# Patient Record
Sex: Male | Born: 1990 | Race: White | Hispanic: No | Marital: Married | Smoking: Never smoker
Health system: Southern US, Community
[De-identification: ages and names within clinical notes are randomized; demographics above are authoritative.]

## PROBLEM LIST (undated history)

## (undated) HISTORY — PX: OTHER SURGICAL HISTORY: SHX169

---
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
## 2012-02-13 ENCOUNTER — Ambulatory Visit (INDEPENDENT_AMBULATORY_CARE_PROVIDER_SITE_OTHER): Payer: BC Managed Care – PPO | Admitting: Pediatrics

## 2012-02-13 DIAGNOSIS — Z289 Immunization not carried out for unspecified reason: Secondary | ICD-10-CM

## 2012-02-26 NOTE — Progress Notes (Signed)
Reviewed immunizations and shots up to date. No vaccine given

## 2012-04-29 ENCOUNTER — Ambulatory Visit (INDEPENDENT_AMBULATORY_CARE_PROVIDER_SITE_OTHER): Payer: BC Managed Care – PPO | Admitting: Pediatrics

## 2012-04-29 VITALS — BP 116/72 | Ht 72.0 in | Wt 160.9 lb

## 2012-04-29 DIAGNOSIS — F988 Other specified behavioral and emotional disorders with onset usually occurring in childhood and adolescence: Secondary | ICD-10-CM

## 2012-04-29 MED ORDER — METHYLPHENIDATE HCL 5 MG PO TABS: 5.0000 mg | ORAL_TABLET | Freq: Two times a day (BID) | ORAL | Status: DC

## 2012-04-29 NOTE — Progress Notes (Signed)
Subjective:     Patient ID: David Elliott, male   DOB: Aug 06, 1990, 21 y.o.   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 [employer] Was taking Concerta 54 mg, but thinks he needs something shorter acting Having trouble focusing during class, some trouble when studying Ease of distractibility, lack of focus Has not taken medication in 3-4 years On Concerta, had some trouble with appetite suppression "Coffee helps a little bit," but not quite enough  NO PMH of heart murmur of abnormal heart rhythm BP is wnl  Calculus, English, Environmental Studies, Lobbyist  Review of Systems [deferred]    Objective:   Physical Exam [deferred]    Assessment:     21 year old CM with Attention Deficit Disorder, has been managed without medication for past 19 years, though now having increased difficulty having just started college.    Plan:     1. Reviewed past and current history of ADD for this patient 2. Will treat as though stimulant naive based on time since last prescription 3. Start with short-acting Methylphenidate 5 mg once or twice per day 4. Advised patient to start using earlier in the day and pay close attention to length of effect and any side effects 5. Will have to mail refills     Methylphenidate 5 mg At first, take during the day and track how long it lasts Track any side effects Mail refills  Total time = 26 minutes, >50% counseling

## 2012-04-29 NOTE — Addendum Note (Signed)
Addended by: Ferman Hamming B on: 04/29/2012 06:59 PM   Modules accepted: Level of Service

## 2012-05-07 ENCOUNTER — Telehealth: Payer: Self-pay

## 2012-05-07 ENCOUNTER — Other Ambulatory Visit: Payer: Self-pay | Admitting: Pediatrics

## 2012-05-07 DIAGNOSIS — F988 Other specified behavioral and emotional disorders with onset usually occurring in childhood and adolescence: Secondary | ICD-10-CM

## 2012-05-07 MED ORDER — METHYLPHENIDATE HCL 5 MG PO TABS: 5.0000 mg | ORAL_TABLET | Freq: Two times a day (BID) | ORAL | Status: DC

## 2012-05-07 NOTE — Telephone Encounter (Signed)
RX for Concerta 5mg 

## 2012-08-11 ENCOUNTER — Telehealth: Payer: Self-pay

## 2012-08-11 ENCOUNTER — Other Ambulatory Visit: Payer: Self-pay | Admitting: Pediatrics

## 2012-08-11 DIAGNOSIS — F988 Other specified behavioral and emotional disorders with onset usually occurring in childhood and adolescence: Secondary | ICD-10-CM

## 2012-08-11 MED ORDER — METHYLPHENIDATE HCL 5 MG PO TABS: 5.0000 mg | ORAL_TABLET | Freq: Two times a day (BID) | ORAL | Status: DC

## 2012-08-11 NOTE — Telephone Encounter (Signed)
RX for Ritalin 10mg 

## 2012-10-14 ENCOUNTER — Other Ambulatory Visit: Payer: Self-pay | Admitting: Pediatrics

## 2012-10-14 ENCOUNTER — Telehealth: Payer: Self-pay | Admitting: Pediatrics

## 2012-10-14 DIAGNOSIS — F988 Other specified behavioral and emotional disorders with onset usually occurring in childhood and adolescence: Secondary | ICD-10-CM

## 2012-10-14 MED ORDER — METHYLPHENIDATE HCL 5 MG PO TABS: 5.0000 mg | ORAL_TABLET | Freq: Two times a day (BID) | ORAL | Status: DC

## 2012-10-14 NOTE — Telephone Encounter (Signed)
Refill request methylphenidate 5 mg 2 x day

## 2013-01-07 ENCOUNTER — Encounter: Payer: Self-pay | Admitting: Pediatrics

## 2013-01-11 ENCOUNTER — Ambulatory Visit: Payer: Self-pay | Admitting: Pediatrics

## 2020-03-17 ENCOUNTER — Ambulatory Visit (INDEPENDENT_AMBULATORY_CARE_PROVIDER_SITE_OTHER): Payer: 59 | Admitting: Family Medicine

## 2020-03-17 ENCOUNTER — Other Ambulatory Visit: Payer: Self-pay

## 2020-03-17 ENCOUNTER — Encounter: Payer: Self-pay | Admitting: Family Medicine

## 2020-03-17 VITALS — BP 110/73 | HR 90 | Temp 97.2°F | Resp 17 | Ht 73.0 in | Wt 181.4 lb

## 2020-03-17 DIAGNOSIS — K429 Umbilical hernia without obstruction or gangrene: Secondary | ICD-10-CM

## 2020-03-17 DIAGNOSIS — R1031 Right lower quadrant pain: Secondary | ICD-10-CM | POA: Diagnosis not present

## 2020-03-17 DIAGNOSIS — Z7689 Persons encountering health services in other specified circumstances: Secondary | ICD-10-CM | POA: Diagnosis not present

## 2020-03-17 DIAGNOSIS — F909 Attention-deficit hyperactivity disorder, unspecified type: Secondary | ICD-10-CM

## 2020-03-17 DIAGNOSIS — Z09 Encounter for follow-up examination after completed treatment for conditions other than malignant neoplasm: Secondary | ICD-10-CM

## 2020-03-17 HISTORY — DX: Attention-deficit hyperactivity disorder, unspecified type: F90.9

## 2020-03-17 LAB — GLUCOSE, POCT (MANUAL RESULT ENTRY): POC Glucose: 116 mg/dl — AB (ref 70–99)

## 2020-03-17 LAB — POCT GLYCOSYLATED HEMOGLOBIN (HGB A1C)
HbA1c POC (<> result, manual entry): 4.9 % (ref 4.0–5.6)
HbA1c, POC (controlled diabetic range): 4.9 % (ref 0.0–7.0)
HbA1c, POC (prediabetic range): 4.9 % — AB (ref 5.7–6.4)
Hemoglobin A1C: 4.9 % (ref 4.0–5.6)

## 2020-03-17 NOTE — Progress Notes (Signed)
Patient [facility]   New Patient--Establish Care  Subjective:  Patient ID: David Elliott, male    DOB: 06-12-1991  Age: 29 y.o. MRN: [id number]  CC:  Chief Complaint  Patient presents with  . Establish Care  . Abdominal Pain    Pt states x2-3wks.     HPI NANDAN WILLEMS is a 29 year old male who presents to [facility] today.    There are no problems to display for this patient.   Past Medical History:  Diagnosis Date  . ADHD (attention deficit hyperactivity disorder) 03/17/2020   Current Status: This will be Mr. Cansler initial office visit with me. He was previously seeing Physician at college for his PCP needs. Since his last office visit, he is doing well with no complaints. He has c/o abdominal pain, r/t his history of umbilical hernia pain. Pressure, pain, and nausea began to slowly increase in the last 2-3 weeks. He has been told that he has an abdominal hernia while he underwent examination at [facility], but discovered that his insurance was no longer accepted at their office. He has moderate abdominal pain today. He denies fevers, chills, fatigue, recent infections, weight loss, and night sweats. He has not had any headaches, visual changes, dizziness, and falls. No chest pain, heart palpitations, cough and shortness of breath reported. No reports of GI problems such as nausea, vomiting, diarrhea, and constipation. He has no reports of blood in stools, dysuria and hematuria. No depression or anxiety reported today. He is taking all medications as prescribed.   Past Surgical History:  Procedure Laterality Date  . ADHD    . right shoulder surgery     2009, 2011    Family History  Problem Relation Age of Onset  . ADD / ADHD Father   . Atrial fibrillation Father   . ADD / ADHD Sister   . Cancer Maternal Grandmother   . Cancer Maternal Grandfather   . Heart disease Maternal Grandfather   . Atrial fibrillation  Paternal Uncle     Social History   Socioeconomic History  . Marital status: Single    Spouse name: Not on file  . Number of children: Not on file  . Years of education: Not on file  . Highest education level: Not on file  Occupational History  . Occupation: COMPUTER PROGRAMER  Tobacco Use  . Smoking status: Never Smoker  . Smokeless tobacco: Never Used  Substance and Sexual Activity  . Alcohol use: Yes  . Drug use: Yes    Frequency: 2.0 times per week    Types: Marijuana  . Sexual activity: Yes    Birth control/protection: Condom  Other Topics Concern  . Not on file  Social History Narrative  . Not on file   Social Determinants of Health   Financial Resource Strain:   . Difficulty of Paying Living Expenses:   Food Insecurity:   . Worried About Charity fundraiser in the Last Year:   . Arboriculturist in the Last Year:   Transportation Needs:   . Film/video editor (Medical):   Marland Kitchen Lack of Transportation (Non-Medical):   Physical Activity:   . Days of Exercise per Week:   . Minutes of Exercise per Session:   Stress:   . Feeling of Stress :   Social Connections:   . Frequency of Communication with Friends and Family:   . Frequency of Social Gatherings with  Friends and Family:   . Attends Religious Services:   . Active Member of Clubs or Organizations:   . Attends Archivist Meetings:   Marland Kitchen Marital Status:   Intimate Partner Violence:   . Fear of Current or Ex-Partner:   . Emotionally Abused:   Marland Kitchen Physically Abused:   . Sexually Abused:     Outpatient Medications Prior to Visit  Medication Sig Dispense Refill  . methylphenidate (RITALIN) 5 MG tablet Take 1 tablet (5 mg total) by mouth 2 (two) times daily. 60 tablet 0   No facility-administered medications prior to visit.    Allergies  Allergen Reactions  . Percocet [Oxycodone-Acetaminophen] Nausea Only    ROS Review of Systems  Constitutional: Negative.   HENT: Negative.   Eyes:  Negative.   Respiratory: Negative.   Cardiovascular: Negative.   Gastrointestinal: Positive for abdominal pain (mid to right quadrant) and nausea (occasional ).  Endocrine: Negative.   Genitourinary: Negative.   Musculoskeletal: Negative.   Skin: Negative.   Allergic/Immunologic: Negative.   Neurological: Negative.   Hematological: Negative.   Psychiatric/Behavioral: Negative.     Objective:    Physical Exam Vitals and nursing note reviewed.  Constitutional:      Appearance: Normal appearance.  HENT:     Head: Normocephalic and atraumatic.     Nose: Nose normal.     Mouth/Throat:     Mouth: Mucous membranes are moist.     Pharynx: Oropharynx is clear.  Cardiovascular:     Rate and Rhythm: Normal rate and regular rhythm.     Pulses: Normal pulses.     Heart sounds: Normal heart sounds.  Pulmonary:     Effort: Pulmonary effort is normal.     Breath sounds: Normal breath sounds.  Abdominal:     General: Bowel sounds are normal.     Palpations: Abdomen is soft.     Tenderness: There is abdominal tenderness. There is guarding.     Comments: Mid-right quadrant hernia palpated.   Musculoskeletal:        General: Normal range of motion.     Cervical back: Normal range of motion and neck supple.  Skin:    General: Skin is warm and dry.  Neurological:     General: No focal deficit present.     Mental Status: He is alert and oriented to person, place, and time.  Psychiatric:        Mood and Affect: Mood normal.        Behavior: Behavior normal.        Thought Content: Thought content normal.        Judgment: Judgment normal.     BP 110/73 (BP Location: Left Arm, Patient Position: Sitting, Cuff Size: Normal)   Pulse 90   Temp (!) 97.2 F (36.2 C)   Resp 17   Ht _0  (1.854 m)   Wt 181 lb 6.4 oz (82.3 kg)   SpO2 97%   BMI 23.93 kg/m  Wt Readings from Last 3 Encounters:  03/17/20 181 lb 6.4 oz (82.3 kg)  04/29/12 160 lb 14.4 oz (73 kg) (64 %, Z= 0.35)*  04/10/10  189 lb 1.6 oz (85.8 kg) (94 %, Z= 1.54)*   * Growth percentiles are based on CDC (Boys, 2-20 Years) data.     Health Maintenance Due  Topic Date Due  . Hepatitis C Screening  Never done  . HIV Screening  Never done  . TETANUS/TDAP  05/10/2015  . INFLUENZA VACCINE  03/05/2020    There are no preventive care reminders to display for this patient.  No results found for: TSH No results found for: WBC, HGB, HCT, MCV, PLT No results found for: NA, K, CHLORIDE, CO2, GLUCOSE, BUN, CREATININE, BILITOT, ALKPHOS, AST, ALT, PROT, ALBUMIN, CALCIUM, ANIONGAP, EGFR, GFR No results found for: CHOL No results found for: HDL No results found for: LDLCALC No results found for: TRIG No results found for: CHOLHDL Lab Results  Component Value Date   HGBA1C 4.9 03/17/2020   HGBA1C 4.9 03/17/2020   HGBA1C 4.9 (A) 03/17/2020   HGBA1C 4.9 03/17/2020      Assessment & Plan:   1. Establishing care with new doctor, encounter for - Urinalysis Dipstick - POC Glucose (CBG) - POC HgB A1c  2. Umbilical hernia without obstruction and without gangrene  3. Acute abdominal pain in right lower quadrant - US Abdomen Complete; Future  4. Follow up He will follow up as needed.   No orders of the defined types were placed in this encounter.   Orders Placed This Encounter  Procedures  . US Abdomen Complete  . Urinalysis Dipstick  . POC Glucose (CBG)  . POC HgB A1c    Referral Orders  No referral(s) requested today    Kathe Becton,  MSN, FNP-BC [address] [place], [place] [address] [phone] fax  Problem List Items Addressed This Visit    None    Visit Diagnoses    Establishing care with new doctor, encounter for    -  Primary   Relevant Orders   Urinalysis Dipstick   POC Glucose (CBG) (Completed)   POC HgB A1c (Completed)   Umbilical hernia without obstruction  and without gangrene       Acute abdominal pain in right lower quadrant       Relevant Orders   US Abdomen Complete   Follow up          No orders of the defined types were placed in this encounter.   Follow-up: No follow-ups on file.    Azzie Glatter, FNP

## 2020-03-18 ENCOUNTER — Encounter: Payer: Self-pay | Admitting: Family Medicine

## 2020-03-20 ENCOUNTER — Other Ambulatory Visit: Payer: Self-pay

## 2020-03-20 ENCOUNTER — Ambulatory Visit (HOSPITAL_COMMUNITY)
Admission: RE | Admit: 2020-03-20 | Discharge: 2020-03-20 | Disposition: A | Discharge status: Home / Self Care | Payer: 59 | Source: Ambulatory Visit | Attending: Family Medicine | Admitting: Family Medicine

## 2020-03-20 DIAGNOSIS — R1031 Right lower quadrant pain: Secondary | ICD-10-CM | POA: Diagnosis not present

## 2020-03-22 ENCOUNTER — Other Ambulatory Visit: Payer: Self-pay | Admitting: Family Medicine

## 2020-03-22 DIAGNOSIS — R1031 Right lower quadrant pain: Secondary | ICD-10-CM

## 2020-03-22 DIAGNOSIS — K439 Ventral hernia without obstruction or gangrene: Secondary | ICD-10-CM

## 2020-03-22 DIAGNOSIS — R1900 Intra-abdominal and pelvic swelling, mass and lump, unspecified site: Secondary | ICD-10-CM

## 2020-03-22 DIAGNOSIS — K469 Unspecified abdominal hernia without obstruction or gangrene: Secondary | ICD-10-CM

## 2020-03-23 ENCOUNTER — Telehealth: Payer: Self-pay

## 2020-03-23 ENCOUNTER — Encounter (HOSPITAL_BASED_OUTPATIENT_CLINIC_OR_DEPARTMENT_OTHER): Payer: Self-pay

## 2020-03-23 ENCOUNTER — Ambulatory Visit (HOSPITAL_BASED_OUTPATIENT_CLINIC_OR_DEPARTMENT_OTHER)
Admission: RE | Admit: 2020-03-23 | Discharge: 2020-03-23 | Disposition: A | Discharge status: Home / Self Care | Payer: 59 | Source: Ambulatory Visit | Attending: Family Medicine | Admitting: Family Medicine

## 2020-03-23 ENCOUNTER — Other Ambulatory Visit: Payer: Self-pay

## 2020-03-23 DIAGNOSIS — K439 Ventral hernia without obstruction or gangrene: Secondary | ICD-10-CM

## 2020-03-23 DIAGNOSIS — R1031 Right lower quadrant pain: Secondary | ICD-10-CM | POA: Insufficient documentation

## 2020-03-23 DIAGNOSIS — K760 Fatty (change of) liver, not elsewhere classified: Secondary | ICD-10-CM | POA: Diagnosis not present

## 2020-03-23 DIAGNOSIS — R1900 Intra-abdominal and pelvic swelling, mass and lump, unspecified site: Secondary | ICD-10-CM | POA: Diagnosis not present

## 2020-03-23 MED ORDER — IOHEXOL 300 MG/ML  SOLN
100.0000 mL | Freq: Once | INTRAMUSCULAR | Status: AC | PRN
  Administered 2020-03-23: 100 mL via INTRAVENOUS

## 2020-03-28 ENCOUNTER — Encounter: Payer: Self-pay | Admitting: Family Medicine

## 2020-03-29 ENCOUNTER — Emergency Department (HOSPITAL_COMMUNITY)
Admission: EM | Admit: 2020-03-29 | Discharge: 2020-03-29 | Disposition: A | Discharge status: Home / Self Care | Payer: 59 | Attending: Emergency Medicine | Admitting: Emergency Medicine

## 2020-03-29 ENCOUNTER — Encounter (HOSPITAL_COMMUNITY): Payer: Self-pay | Admitting: Emergency Medicine

## 2020-03-29 ENCOUNTER — Telehealth: Payer: Self-pay | Admitting: Family Medicine

## 2020-03-29 ENCOUNTER — Other Ambulatory Visit: Payer: Self-pay

## 2020-03-29 DIAGNOSIS — F909 Attention-deficit hyperactivity disorder, unspecified type: Secondary | ICD-10-CM | POA: Diagnosis not present

## 2020-03-29 DIAGNOSIS — R1011 Right upper quadrant pain: Secondary | ICD-10-CM | POA: Insufficient documentation

## 2020-03-29 DIAGNOSIS — R109 Unspecified abdominal pain: Secondary | ICD-10-CM | POA: Diagnosis present

## 2020-03-29 LAB — COMPREHENSIVE METABOLIC PANEL
ALT: 24 U/L (ref 0–44)
AST: 21 U/L (ref 15–41)
Albumin: 4.7 g/dL (ref 3.5–5.0)
Alkaline Phosphatase: 48 U/L (ref 38–126)
Anion gap: 6 (ref 5–15)
BUN: 17 mg/dL (ref 6–20)
CO2: 26 mmol/L (ref 22–32)
Calcium: 9.1 mg/dL (ref 8.9–10.3)
Chloride: 107 mmol/L (ref 98–111)
Creatinine, Ser: 0.96 mg/dL (ref 0.61–1.24)
GFR calc Af Amer: >60 mL/min (ref >60)
GFR calc non Af Amer: >60 mL/min (ref >60)
Glucose, Bld: 96 mg/dL (ref 70–99)
Potassium: 4 mmol/L (ref 3.5–5.1)
Sodium: 139 mmol/L (ref 135–145)
Total Bilirubin: 0.3 mg/dL (ref 0.3–1.2)
Total Protein: 7.6 g/dL (ref 6.5–8.1)

## 2020-03-29 LAB — CBC
HCT: 42 % (ref 39.0–52.0)
Hemoglobin: 14.3 g/dL (ref 13.0–17.0)
MCH: 31.6 pg (ref 26.0–34.0)
MCHC: 34 g/dL (ref 30.0–36.0)
MCV: 92.9 fL (ref 80.0–100.0)
Platelets: 228 10*3/uL (ref 150–400)
RBC: 4.52 MIL/uL (ref 4.22–5.81)
RDW: 12.8 % (ref 11.5–15.5)
WBC: 4.7 10*3/uL (ref 4.0–10.5)
nRBC: 0 % (ref 0.0–0.2)

## 2020-03-29 LAB — LIPASE, BLOOD: Lipase: 24 U/L (ref 11–51)

## 2020-03-29 LAB — URINALYSIS, ROUTINE W REFLEX MICROSCOPIC
Bilirubin Urine: NEGATIVE
Glucose, UA: NEGATIVE mg/dL
Hgb urine dipstick: NEGATIVE
Ketones, ur: NEGATIVE mg/dL
Leukocytes,Ua: NEGATIVE
Nitrite: NEGATIVE
Protein, ur: NEGATIVE mg/dL
Specific Gravity, Urine: 1.004 — ABNORMAL LOW (ref 1.005–1.030)
pH: 6 (ref 5.0–8.0)

## 2020-03-29 NOTE — ED Provider Notes (Signed)
[facility]-EMERGENCY DEPT Provider Note   CSN: [id number] Arrival date & time: 03/29/20  1101     History Chief Complaint  Patient presents with  . Abdominal Pain    David Elliott is a 29 y.o. male.  HPI He presents for evaluation of intermittent abdominal pain in the umbilicus and right upper quadrant, present for 1 month, seemed to be aggravated by exercising, and status post evaluations by 3 different providers.  He has had ultrasound imaging and CT of the abdomen pelvis.  These tests are available in the EMR, and are normal.  He has had a few episodes of "bloating."  The patient denies fever, chills, nausea, vomiting, diarrhea, constipation, or urine difficulty.  No prior abdominal surgery.  No history of similar problems.  He has been working out somewhat more than usual recently and wonders if that might be causing his pain.  He is doing a lot of core exercises.  No known sick contacts.  There are no other known modifying factors.    Past Medical History:  Diagnosis Date  . ADHD (attention deficit hyperactivity disorder) 03/17/2020    There are no problems to display for this patient.   Past Surgical History:  Procedure Laterality Date  . ADHD    . right shoulder surgery     2009, 2011       Family History  Problem Relation Age of Onset  . ADD / ADHD Father   . Atrial fibrillation Father   . ADD / ADHD Sister   . Cancer Maternal Grandmother   . Cancer Maternal Grandfather   . Heart disease Maternal Grandfather   . Atrial fibrillation Paternal Uncle     Social History   Tobacco Use  . Smoking status: Never Smoker  . Smokeless tobacco: Never Used  Substance Use Topics  . Alcohol use: Yes  . Drug use: Yes    Frequency: 2.0 times per week    Types: Marijuana    Home Medications Prior to Admission medications   Not on File    Allergies    Percocet [oxycodone-acetaminophen]  Review of Systems   Review of Systems  All other  systems reviewed and are negative.   Physical Exam Updated Vital Signs BP 120/80 (BP Location: Right Arm)   Pulse 64   Temp 98.5 F (36.9 C) (Oral)   Resp 14   SpO2 100%   Physical Exam Vitals and nursing note reviewed.  Constitutional:      General: He is not in acute distress.    Appearance: He is well-developed. He is not ill-appearing, toxic-appearing or diaphoretic.  HENT:     Head: Normocephalic and atraumatic.     Right Ear: External ear normal.     Left Ear: External ear normal.  Eyes:     Conjunctiva/sclera: Conjunctivae normal.     Pupils: Pupils are equal, round, and reactive to light.  Neck:     Trachea: Phonation normal.  Cardiovascular:     Rate and Rhythm: Normal rate.  Pulmonary:     Effort: Pulmonary effort is normal. No respiratory distress.     Breath sounds: No stridor. No rhonchi.  Chest:     Chest wall: No tenderness.  Abdominal:     General: There is no distension.     Palpations: Abdomen is soft. There is no mass.     Tenderness: There is no abdominal tenderness. There is no guarding.     Hernia: No hernia is present.  Musculoskeletal:        General: Normal range of motion.     Cervical back: Normal range of motion and neck supple.  Skin:    General: Skin is warm and dry.  Neurological:     Mental Status: He is alert and oriented to person, place, and time.     Cranial Nerves: No cranial nerve deficit.     Sensory: No sensory deficit.     Motor: No abnormal muscle tone.     Coordination: Coordination normal.  Psychiatric:        Mood and Affect: Mood normal.        Behavior: Behavior normal.        Thought Content: Thought content normal.        Judgment: Judgment normal.     ED Results / Procedures / Treatments   Labs (all labs ordered are listed, but only abnormal results are displayed) Labs Reviewed  URINALYSIS, ROUTINE W REFLEX MICROSCOPIC - Abnormal; Notable for the following components:      Result Value   Color, Urine  STRAW (*)    Specific Gravity, Urine 1.004 (*)    All other components within normal limits  LIPASE, BLOOD  COMPREHENSIVE METABOLIC PANEL  CBC    EKG None  Radiology No results found.  Procedures Procedures (including critical care time)  Medications Ordered in ED Medications - No data to display  ED Course  I have reviewed the triage vital signs and the nursing notes.  Pertinent labs & imaging results that were available during my care of the patient were reviewed by me and considered in my medical decision making (see chart for details).    MDM Rules/Calculators/A&P                           Patient Vitals for the past 24 hrs:  BP Temp Temp src Pulse Resp SpO2  03/29/20 1738 138/86 -- -- 63 16 100 %  03/29/20 1411 120/80 98.5 F (36.9 C) Oral 64 14 100 %  03/29/20 1115 (!) 142/95 98.1 F (36.7 C) Oral 70 16 100 %    At discharge reevaluation with update and discussion. After initial assessment and treatment, an updated evaluation reveals he remains comfortable has no further complaints. Mancel Bale   Medical Decision Making:  This patient is presenting for evaluation of abdominal pain for 1 month, which does require a range of treatment options, and is a complaint that involves a moderate risk of morbidity and mortality. The differential diagnoses include gastritis, intestinal disorders, functional bowel disease. I decided to review old records, and in summary healthy young male presenting with intermittent abdominal pain for 1 month without specific localizing symptoms.  I did not require additional historical information from anyone.  Clinical Laboratory Tests Ordered, included CBC, Metabolic panel, Urinalysis and Lipase. Review indicates all tests are normal.     Critical Interventions-clinical evaluation, laboratory testing, review of prior imaging studies, observation reassessment  After These Interventions, the Patient was reevaluated and was found  stable for discharge.  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  Also consider anxiety as source.  No indication for hospitalization or further ED evaluation.  Patient accepts referral to gastroenterology to be evaluated for functional bowel disorder.  CRITICAL CARE-no Performed by: Mancel Bale  Nursing Notes Reviewed/ Care Coordinated Applicable Imaging Reviewed Interpretation of Laboratory Data incorporated into ED treatment  The patient appears reasonably screened and/or stabilized  for discharge and I doubt any other medical condition or other [facility] requiring further screening, evaluation, or treatment in the ED at this time prior to discharge.  Plan: Home Medications-routine OTC medicines as needed; Home Treatments-regular diet and plenty of fluids; return here if the recommended treatment, does not improve the symptoms; Recommended follow up-GI follow-up as needed.     Final Clinical Impression(s) / ED Diagnoses Final diagnoses:  None    Rx / DC Orders ED Discharge Orders    None       Mancel Bale, MD 03/29/20 1904

## 2020-03-29 NOTE — ED Triage Notes (Signed)
Pt c/o RUQ pains for over week. Seen PCP who did [place] and CT scan which didn't show anything. Was told to go to Ed if pains worsening, so patient is here. Has some nausea. Denies vomiting, urinary or bowel problems. Hx hernia.

## 2020-03-29 NOTE — ED Notes (Signed)
Pt states has been following up with PCP for current abdominal pain x 1 month. Pt states he was told that PCP felt a hernia on upper right quadrant of abdomen, but it was not seen on ultrasound or CT. Pt was advised by PCP to go to ER if pain worsened which pt states happened this morning. Pt states at this time pain is 8/10 pain right below in his ribs on the right side. Pt denies pain radiating anywhere. Pt denies nausea, vomiting or diarrhea.

## 2020-03-29 NOTE — Discharge Instructions (Addendum)
Reviewing your symptoms, imaging studies and lab tests, no serious problems were discovered.  Your pain may be from a functional bowel disease, such as intestinal disorder.  A gastroenterologist could help diagnose and treat this disorder.  They can also give you another opinion regarding your pain.  Please call the listed healthcare provider office for an appointment to be seen for further care and treatment.

## 2020-03-31 NOTE — Telephone Encounter (Signed)
Sent to NP 

## 2020-04-04 ENCOUNTER — Other Ambulatory Visit: Payer: Self-pay

## 2020-04-05 ENCOUNTER — Encounter: Payer: Self-pay | Admitting: Gastroenterology

## 2020-04-05 ENCOUNTER — Ambulatory Visit (INDEPENDENT_AMBULATORY_CARE_PROVIDER_SITE_OTHER): Payer: 59 | Admitting: Gastroenterology

## 2020-04-05 DIAGNOSIS — R1011 Right upper quadrant pain: Secondary | ICD-10-CM | POA: Diagnosis not present

## 2020-04-05 DIAGNOSIS — R140 Abdominal distension (gaseous): Secondary | ICD-10-CM | POA: Diagnosis not present

## 2020-04-05 NOTE — Progress Notes (Signed)
HPI: This is a very pleasant 29 year old man who was referred to me by Kallie Locks, FNP  to evaluate right upper quadrant pains, bloating.    For about 2 weeks he has had intermittent right upper quadrant sharp pains.  This is associate with some bloating and mild nausea.  The pains are very brief, lasting only a few seconds.  They are sharp.  They actually seem to be somewhat positional on onset.  One of his worst attacks is when he was trying to sit up in bed once.  They do not seem to be related to eating or moving his bowels.  He has regular stools which are not really loose or constipated.  His weight has been overall stable.  He does not take NSAIDs.  He has had no overt bleeding.  He has had a battery of blood test as well as imaging studies see those below.  He stopped eating gluten in his diet for about 4 days and noticed definite improvement in his overall symptoms.  He resumed eating gluten 3 days ago.  The discomforts returned.  Old Data Reviewed:  August 2021 abdominal ultrasound was completely normal August 2021 CT scan abdomen pelvis with IV and oral contrast showed mild fatty infiltration of liver but was otherwise completely normal Blood work August 2021 shows completely normal CBC, normal lipase, normal complete metabolic profile   Review of systems: Pertinent positive and negative review of systems were noted in the above HPI section. All other review negative.   Past Medical History:  Diagnosis Date  . ADHD (attention deficit hyperactivity disorder) 03/17/2020    Past Surgical History:  Procedure Laterality Date  . right shoulder surgery     2009, 2011    No current outpatient medications on file.   No current facility-administered medications for this visit.    Allergies as of 04/05/2020 - Review Complete 04/05/2020  Allergen Reaction Noted  . Oxycodone-acetaminophen Nausea Only 08/10/2019    Family History  Problem Relation Age of Onset  .  ADD / ADHD Father   . Atrial fibrillation Father   . ADD / ADHD Sister   . Cancer Maternal Grandmother   . Cancer Maternal Grandfather   . Heart disease Maternal Grandfather   . Atrial fibrillation Paternal Uncle     Social History   Socioeconomic History  . Marital status: Single    Spouse name: Not on file  . Number of children: Not on file  . Years of education: Not on file  . Highest education level: Not on file  Occupational History  . Occupation: COMPUTER PROGRAMER  Tobacco Use  . Smoking status: Never Smoker  . Smokeless tobacco: Never Used  Vaping Use  . Vaping Use: Never used  Substance and Sexual Activity  . Alcohol use: Yes  . Drug use: Yes    Frequency: 2.0 times per week    Types: Marijuana  . Sexual activity: Yes    Birth control/protection: Condom  Other Topics Concern  . Not on file  Social History Narrative  . Not on file   Social Determinants of Health   Financial Resource Strain:   . Difficulty of Paying Living Expenses: Not on file  Food Insecurity:   . Worried About Programme researcher, broadcasting/film/video in the Last Year: Not on file  . Ran Out of Food in the Last Year: Not on file  Transportation Needs:   . Lack of Transportation (Medical): Not on file  . Lack of  Transportation (Non-Medical): Not on file  Physical Activity:   . Days of Exercise per Week: Not on file  . Minutes of Exercise per Session: Not on file  Stress:   . Feeling of Stress : Not on file  Social Connections:   . Frequency of Communication with Friends and Family: Not on file  . Frequency of Social Gatherings with Friends and Family: Not on file  . Attends Religious Services: Not on file  . Active Member of Clubs or Organizations: Not on file  . Attends Banker Meetings: Not on file  . Marital Status: Not on file  Intimate Partner Violence:   . Fear of Current or Ex-Partner: Not on file  . Emotionally Abused: Not on file  . Physically Abused: Not on file  . Sexually  Abused: Not on file     Physical Exam: Ht 6\' 1"  (1.854 m)   Wt 177 lb (80.3 kg)   BMI 23.35 kg/m  Constitutional: generally well-appearing Psychiatric: alert and oriented x3 Eyes: extraocular movements intact Mouth: oral pharynx moist, no lesions Neck: supple no lymphadenopathy Cardiovascular: heart regular rate and rhythm Lungs: clear to auscultation bilaterally Abdomen: soft, nontender, nondistended, no obvious ascites, no peritoneal signs, normal bowel sounds Extremities: no lower extremity edema bilaterally Skin: no lesions on visible extremities   Assessment and plan: 29 y.o. male with right upper quadrant pain, bloating  Imaging and blood tests have thus far been negative.  He has definitely noticed an improvement in his symptoms when he stopped eating gluten.  The improvement was not complete but it was clear and very noticeable.  In addition when he resumed gluten 3 days ago the symptoms seemed to worsen again.  I think there is certainly a pretty good chance that he has celiac sprue and I am going to focus on that diagnosis for now.  He is going to continue eating gluten in his diet and early next week he will have labs checked, TTG and total IgA.  We will also put him on the books for an EGD in 10 days to 2 weeks from now. I see no reason for any further blood tests or imaging studies prior to then.  Please see the "Patient Instructions" section for addition details about the plan.   30, MD [facility] 04/05/2020, 11:07 AM  Cc: 06/05/2020, FNP  Total time on date of encounter was 45 minutes (this included time spent preparing to see the patient reviewing records; obtaining and/or reviewing separately obtained history; performing a medically appropriate exam and/or evaluation; counseling and educating the patient and family if present; ordering medications, tests or procedures if applicable; and documenting clinical information in the health  record).

## 2020-04-05 NOTE — Patient Instructions (Addendum)
If you are age 27 or older, your body mass index should be between 23-30. Your Body mass index is 23.35 kg/m. If this is out of the aforementioned range listed, please consider follow up with your Primary Care Provider.  If you are age 76 or younger, your body mass index should be between 19-25. Your Body mass index is 23.35 kg/m. If this is out of the aformentioned range listed, please consider follow up with your Primary Care Provider.   Your provider has requested that you go to the basement level for lab work on 04-10-20. Press "B" on the elevator. The lab is located at the first door on the left as you exit the elevator.  You have been scheduled for an endoscopy. Please follow written instructions given to you at your visit today. If you use inhalers (even only as needed), please bring them with you on the day of your procedure.  Due to recent changes in healthcare laws, you may see the results of your imaging and laboratory studies on MyChart before your provider has had a chance to review them.  We understand that in some cases there may be results that are confusing or concerning to you. Not all laboratory results come back in the same time frame and the provider may be waiting for multiple results in order to interpret others.  Please give [place] 48 hours in order for your provider to thoroughly review all the results before contacting the office for clarification of your results.   Thank you for entrusting me with your care and choosing [facility] [address] [facility]  Dr Christella Hartigan

## 2020-04-11 ENCOUNTER — Other Ambulatory Visit (INDEPENDENT_AMBULATORY_CARE_PROVIDER_SITE_OTHER): Payer: 59

## 2020-04-11 DIAGNOSIS — R1011 Right upper quadrant pain: Secondary | ICD-10-CM | POA: Diagnosis not present

## 2020-04-11 DIAGNOSIS — R140 Abdominal distension (gaseous): Secondary | ICD-10-CM | POA: Diagnosis not present

## 2020-04-11 LAB — IGA: IgA: 237 mg/dL (ref 68–378)

## 2020-04-12 LAB — TISSUE TRANSGLUTAMINASE, IGA: (tTG) Ab, IgA: 1 U/mL

## 2020-04-18 ENCOUNTER — Encounter: Payer: Self-pay | Admitting: Gastroenterology

## 2020-04-21 ENCOUNTER — Other Ambulatory Visit: Payer: Self-pay

## 2020-04-21 ENCOUNTER — Ambulatory Visit (AMBULATORY_SURGERY_CENTER): Payer: 59 | Admitting: Gastroenterology

## 2020-04-21 ENCOUNTER — Encounter: Payer: Self-pay | Admitting: Gastroenterology

## 2020-04-21 VITALS — BP 122/77 | HR 66 | Temp 98.0°F | Resp 13 | Ht 73.0 in | Wt 177.0 lb

## 2020-04-21 DIAGNOSIS — K2970 Gastritis, unspecified, without bleeding: Secondary | ICD-10-CM

## 2020-04-21 DIAGNOSIS — R1011 Right upper quadrant pain: Secondary | ICD-10-CM

## 2020-04-21 DIAGNOSIS — K449 Diaphragmatic hernia without obstruction or gangrene: Secondary | ICD-10-CM | POA: Diagnosis not present

## 2020-04-21 DIAGNOSIS — R140 Abdominal distension (gaseous): Secondary | ICD-10-CM

## 2020-04-21 DIAGNOSIS — K319 Disease of stomach and duodenum, unspecified: Secondary | ICD-10-CM | POA: Diagnosis not present

## 2020-04-21 DIAGNOSIS — K2950 Unspecified chronic gastritis without bleeding: Secondary | ICD-10-CM | POA: Diagnosis not present

## 2020-04-21 MED ORDER — SODIUM CHLORIDE 0.9 % IV SOLN: 500.0000 mL | Freq: Once | INTRAVENOUS | Status: DC

## 2020-04-21 NOTE — Op Note (Signed)
[facility] Patient Name: David Elliott Procedure Date: 04/21/2020 2:51 PM MRN: [id number] Endoscopist: Rachael Fee , MD Age: 29 Referring MD:  Date of Birth: 03-Aug-1991 Gender: Male Account #: [id number] Procedure:                Upper GI endoscopy Indications:              Abdominal pain in the right upper quadrant, bloating Medicines:                Monitored Anesthesia Care Procedure:                Pre-Anesthesia Assessment:                           - Prior to the procedure, a History and Physical                            was performed, and patient medications and                            allergies were reviewed. The patient's tolerance of                            previous anesthesia was also reviewed. The risks                            and benefits of the procedure and the sedation                            options and risks were discussed with the patient.                            All questions were answered, and informed consent                            was obtained. Prior Anticoagulants: The patient has                            taken no previous anticoagulant or antiplatelet                            agents. ASA Grade Assessment: II - A patient with                            mild systemic disease. After reviewing the risks                            and benefits, the patient was deemed in                            satisfactory condition to undergo the procedure.                           After obtaining informed consent, the endoscope was  passed under direct vision. Throughout the                            procedure, the patient's blood pressure, pulse, and                            oxygen saturations were monitored continuously. The                            Endoscope was introduced through the mouth, and                            advanced to the second part of duodenum. The upper                             GI endoscopy was accomplished without difficulty.                            The patient tolerated the procedure well. Scope In: Scope Out: Findings:                 A small hiatal hernia was present.                           Minimal inflammation characterized by erythema was                            found in the gastric antrum. Biopsies were taken                            with a cold forceps for histology.                           Biopsies for histology were taken with a cold                            forceps in the normal appearing duodenum for                            evaluation of celiac disease.                           The exam was otherwise without abnormality. Complications:            No immediate complications. Estimated blood loss:                            None. Estimated Blood Loss:     Estimated blood loss: none. Impression:               - Small hiatal hernia.                           - Mild gastritis. Biopsied to check for H. pylori.                           -  Biopsies were taken with a cold forceps for                            evaluation of celiac disease. Recommendation:           - Patient has a contact number available for                            emergencies. The signs and symptoms of potential                            delayed complications were discussed with the                            patient. Return to normal activities tomorrow.                            Written discharge instructions were provided to the                            patient.                           - Resume previous diet.                           - Continue present medications.                           - Await pathology results. If the biopsies are not                            helpful, will consider HIDA scan of gallbladder to                            estimate GB EF. Rachael Fee, MD 04/21/2020 3:07:24 PM This report has been signed electronically.

## 2020-04-21 NOTE — Progress Notes (Signed)
Pt's states no medical or surgical changes since previsit or office visit. 

## 2020-04-21 NOTE — Progress Notes (Signed)
Called to room to assist during endoscopic procedure.  Patient ID and intended procedure confirmed with present staff. Received instructions for my participation in the procedure from the performing physician.  

## 2020-04-21 NOTE — Progress Notes (Signed)
Report given to PACU, vss 

## 2020-04-21 NOTE — Patient Instructions (Signed)
Handout provided on gastritis and hiatal hernia.   YOU HAD AN ENDOSCOPIC PROCEDURE TODAY AT THE [facility]:   Refer to the procedure report that was given to you for any specific questions about what was found during the examination.  If the procedure report does not answer your questions, please call your gastroenterologist to clarify.  If you requested that your care partner not be given the details of your procedure findings, then the procedure report has been included in a sealed envelope for you to review at your convenience later.  YOU SHOULD EXPECT: Some feelings of bloating in the abdomen. Passage of more gas than usual.  Walking can help get rid of the air that was put into your GI tract during the procedure and reduce the bloating. If you had a lower endoscopy (such as a colonoscopy or flexible sigmoidoscopy) you may notice spotting of blood in your stool or on the toilet paper. If you underwent a bowel prep for your procedure, you may not have a normal bowel movement for a few days.  Please Note:  You might notice some irritation and congestion in your nose or some drainage.  This is from the oxygen used during your procedure.  There is no need for concern and it should clear up in a day or so.  SYMPTOMS TO REPORT IMMEDIATELY:   Following upper endoscopy (EGD)  Vomiting of blood or coffee ground material  New chest pain or pain under the shoulder blades  Painful or persistently difficult swallowing  New shortness of breath  Fever of 100F or higher  Black, tarry-looking stools  For urgent or emergent issues, a gastroenterologist can be reached at any hour by calling [phone]. Do not use MyChart messaging for urgent concerns.    DIET:  We do recommend a small meal at first, but then you may proceed to your regular diet.  Drink plenty of fluids but you should avoid alcoholic beverages for 24 hours.  ACTIVITY:  You should plan to take it easy for the rest of today  and you should NOT DRIVE or use heavy machinery until tomorrow (because of the sedation medicines used during the test).    FOLLOW UP: Our staff will call the number listed on your records 48-72 hours following your procedure to check on you and address any questions or concerns that you may have regarding the information given to you following your procedure. If we do not reach you, we will leave a message.  We will attempt to reach you two times.  During this call, we will ask if you have developed any symptoms of COVID 19. If you develop any symptoms (ie: fever, flu-like symptoms, shortness of breath, cough etc.) before then, please call [phone].  If you test positive for Covid 19 in the 2 weeks post procedure, please call and report this information to us.    If any biopsies were taken you will be contacted by phone or by letter within the next 1-3 weeks.  Please call us at [phone] if you have not heard about the biopsies in 3 weeks.    SIGNATURES/CONFIDENTIALITY: You and/or your care partner have signed paperwork which will be entered into your electronic medical record.  These signatures attest to the fact that that the information above on your After Visit Summary has been reviewed and is understood.  Full responsibility of the confidentiality of this discharge information lies with you and/or your care-partner.  

## 2020-04-21 NOTE — Progress Notes (Signed)
1454 Robinul 0.1 mg IV given due large amount of secretions upon assessment.  MD made aware, vss 

## 2020-04-25 ENCOUNTER — Telehealth: Payer: Self-pay | Admitting: *Deleted

## 2020-04-25 NOTE — Telephone Encounter (Signed)
  Follow up Call-  Call back number 04/21/2020  Post procedure Call Back phone  # [phone]  Permission to leave phone message Yes  Some recent data might be hidden     Patient questions:  Do you have a fever, pain , or abdominal swelling? No. Pain Score  0 *  Have you tolerated food without any problems? Yes.    Have you been able to return to your normal activities? Yes.    Do you have any questions about your discharge instructions: Diet   No. Medications  No. Follow up visit  No.  Do you have questions or concerns about your Care? No.  Actions: * If pain score is 4 or above: No action needed, pain <4.  1. Have you developed a fever since your procedure? no  2.   Have you had an respiratory symptoms (SOB or cough) since your procedure? no  3.   Have you tested positive for COVID 19 since your procedure no  4.   Have you had any family members/close contacts diagnosed with the COVID 19 since your procedure?  no   If yes to any of these questions please route to Laverna Peace, RN and Karlton Lemon, RN

## 2020-04-28 ENCOUNTER — Telehealth: Payer: Self-pay | Admitting: Adult Health

## 2020-04-28 ENCOUNTER — Other Ambulatory Visit: Payer: Self-pay

## 2020-04-28 DIAGNOSIS — R1011 Right upper quadrant pain: Secondary | ICD-10-CM

## 2020-04-28 NOTE — Telephone Encounter (Signed)
Patient states call was for his father. Patient phone number is [phone].

## 2020-04-28 NOTE — Telephone Encounter (Signed)
Front to correct msg under the correct pt

## 2020-05-09 ENCOUNTER — Ambulatory Visit (HOSPITAL_COMMUNITY)
Admission: RE | Admit: 2020-05-09 | Discharge: 2020-05-09 | Disposition: A | Discharge status: Home / Self Care | Payer: 59 | Source: Ambulatory Visit | Attending: Gastroenterology | Admitting: Gastroenterology

## 2020-05-09 ENCOUNTER — Other Ambulatory Visit: Payer: Self-pay

## 2020-05-09 DIAGNOSIS — R1011 Right upper quadrant pain: Secondary | ICD-10-CM | POA: Insufficient documentation

## 2020-05-09 MED ORDER — TECHNETIUM TC 99M MEBROFENIN IV KIT
4.7800 | PACK | Freq: Once | INTRAVENOUS | Status: AC
  Administered 2020-05-09: 4.78 via INTRAVENOUS

## 2020-06-03 ENCOUNTER — Encounter: Payer: Self-pay | Admitting: Family Medicine

## 2020-06-08 ENCOUNTER — Other Ambulatory Visit: Payer: Self-pay | Admitting: Family Medicine

## 2020-06-08 DIAGNOSIS — E039 Hypothyroidism, unspecified: Secondary | ICD-10-CM

## 2020-06-08 DIAGNOSIS — Z0000 Encounter for general adult medical examination without abnormal findings: Secondary | ICD-10-CM

## 2020-07-12 ENCOUNTER — Encounter: Payer: Self-pay | Admitting: Gastroenterology

## 2020-07-12 ENCOUNTER — Ambulatory Visit (INDEPENDENT_AMBULATORY_CARE_PROVIDER_SITE_OTHER): Payer: 59 | Admitting: Gastroenterology

## 2020-07-12 VITALS — BP 90/68 | HR 76 | Ht 73.0 in | Wt 178.0 lb

## 2020-07-12 DIAGNOSIS — R109 Unspecified abdominal pain: Secondary | ICD-10-CM | POA: Diagnosis not present

## 2020-07-12 NOTE — Progress Notes (Signed)
Review of pertinent gastrointestinal problems: 1.  Intermittent right upper quadrant pain; 2021 abdominal ultrasound completely normal.  August 2021 CT scan abdomen pelvis with IV and oral contrast showed mild fatty infiltration of the liver but was otherwise completely normal.  Blood work August 2021 showed normal CBC, normal lipase, normal complete metabolic profile.  EGD September 2021 showed small hiatal hernia, very mild nonspecific distal gastritis.  Biopsies were taken from the stomach and the duodenum.  He did not have H. pylori and did not have signs of celiac sprue.  HIDA scan with gallbladder ejection fraction October 2021 was normal.   HPI: This is a very pleasant 29 year old man whom I last saw at the time of an upper endoscopy two or 3 months ago.  Since then he started fiber supplement as well as probiotic.  This has definitely helped his alternating bowel pattern and has somewhat helped his intermittent right-sided brief abdominal pain.  The pain is not related to eating.  It can be somewhat positional.  His weight is overall stable.  He works as a Quarry manager, coding.   ROS: complete GI ROS as described in HPI, all other review negative.  Constitutional:  No unintentional weight loss   Past Medical History:  Diagnosis Date  . ADHD (attention deficit hyperactivity disorder) 03/17/2020    Past Surgical History:  Procedure Laterality Date  . right shoulder surgery     2009, 2011    No current outpatient medications on file.   No current facility-administered medications for this visit.    Allergies as of 07/12/2020 - Review Complete 07/12/2020  Allergen Reaction Noted  . Oxycodone-acetaminophen Nausea Only 08/10/2019    Family History  Problem Relation Age of Onset  . ADD / ADHD Father   . Atrial fibrillation Father   . ADD / ADHD Sister   . Cancer Maternal Grandmother   . Cancer Maternal Grandfather   . Heart disease Maternal Grandfather   .  Atrial fibrillation Paternal Uncle   . Stomach cancer Neg Hx   . Rectal cancer Neg Hx   . Colon cancer Neg Hx   . Esophageal cancer Neg Hx     Social History   Socioeconomic History  . Marital status: Married    Spouse name: Not on file  . Number of children: Not on file  . Years of education: Not on file  . Highest education level: Not on file  Occupational History  . Occupation: COMPUTER PROGRAMER  Tobacco Use  . Smoking status: Never Smoker  . Smokeless tobacco: Never Used  Vaping Use  . Vaping Use: Never used  Substance and Sexual Activity  . Alcohol use: Yes  . Drug use: Yes    Frequency: 2.0 times per week    Types: Marijuana  . Sexual activity: Yes    Birth control/protection: Condom  Other Topics Concern  . Not on file  Social History Narrative  . Not on file   Social Determinants of Health   Financial Resource Strain:   . Difficulty of Paying Living Expenses: Not on file  Food Insecurity:   . Worried About Programme researcher, broadcasting/film/video in the Last Year: Not on file  . Ran Out of Food in the Last Year: Not on file  Transportation Needs:   . Lack of Transportation (Medical): Not on file  . Lack of Transportation (Non-Medical): Not on file  Physical Activity:   . Days of Exercise per Week: Not on file  . Minutes of  Exercise per Session: Not on file  Stress:   . Feeling of Stress : Not on file  Social Connections:   . Frequency of Communication with Friends and Family: Not on file  . Frequency of Social Gatherings with Friends and Family: Not on file  . Attends Religious Services: Not on file  . Active Member of Clubs or Organizations: Not on file  . Attends Banker Meetings: Not on file  . Marital Status: Not on file  Intimate Partner Violence:   . Fear of Current or Ex-Partner: Not on file  . Emotionally Abused: Not on file  . Physically Abused: Not on file  . Sexually Abused: Not on file     Physical Exam: BP 90/68   Pulse 76   Ht 6\' 1"   (1.854 m)   Wt 178 lb (80.7 kg)   BMI 23.48 kg/m  Constitutional: generally well-appearing Psychiatric: alert and oriented x3 Very slightly tender right low ribs Abdomen: soft, nontender, nondistended, no obvious ascites, no peritoneal signs, normal bowel sounds No peripheral edema noted in lower extremities  Assessment and plan: 29 y.o. male with intermittent right-sided abdominal pains  I do not think he needs any other dedicated GI testing.  It is not clear to me that his right-sided abdominal pains are GI related.  He does have slight tenderness right lower ribs.  This might be minor costochondritis.  Seems to be improving as his bowels improve and so possibly it is related to distention of the colon intermittently.  I recommended that he continue taking fiber supplements on a daily basis.  I explained to him that probiotics probably are not helping at all that he can stop taking them.  He knows to call if he has any further questions or concerns or if these pains worsen.  Please see the "Patient Instructions" section for addition details about the plan.  36, MD [facility] 07/12/2020, 9:16 AM   Total time on date of encounter was 20 minutes (this included time spent preparing to see the patient reviewing records; obtaining and/or reviewing separately obtained history; performing a medically appropriate exam and/or evaluation; counseling and educating the patient and family if present; ordering medications, tests or procedures if applicable; and documenting clinical information in the health record).

## 2020-07-12 NOTE — Patient Instructions (Signed)
If you are age 27 or older, your body mass index should be between 23-30. Your Body mass index is 23.48 kg/m. If this is out of the aforementioned range listed, please consider follow up with your Primary Care Provider.  If you are age 3 or younger, your body mass index should be between 19-25. Your Body mass index is 23.48 kg/m. If this is out of the aformentioned range listed, please consider follow up with your Primary Care Provider.   Please continue fiber supplement daily.  STOP: probiotic  Thank you for entrusting me with your care and choosing [facility].  Dr Christella Hartigan

## 2021-03-27 DIAGNOSIS — R7309 Other abnormal glucose: Secondary | ICD-10-CM | POA: Diagnosis not present

## 2021-03-27 DIAGNOSIS — R7989 Other specified abnormal findings of blood chemistry: Secondary | ICD-10-CM | POA: Diagnosis not present

## 2021-03-27 DIAGNOSIS — Z0000 Encounter for general adult medical examination without abnormal findings: Secondary | ICD-10-CM | POA: Diagnosis not present

## 2021-05-16 DIAGNOSIS — F411 Generalized anxiety disorder: Secondary | ICD-10-CM | POA: Diagnosis not present

## 2021-05-17 DIAGNOSIS — G5702 Lesion of sciatic nerve, left lower limb: Secondary | ICD-10-CM | POA: Diagnosis not present

## 2021-05-17 DIAGNOSIS — M542 Cervicalgia: Secondary | ICD-10-CM | POA: Diagnosis not present

## 2021-05-29 DIAGNOSIS — G5702 Lesion of sciatic nerve, left lower limb: Secondary | ICD-10-CM | POA: Diagnosis not present

## 2021-05-29 DIAGNOSIS — M542 Cervicalgia: Secondary | ICD-10-CM | POA: Diagnosis not present

## 2021-05-29 DIAGNOSIS — M7918 Myalgia, other site: Secondary | ICD-10-CM | POA: Diagnosis not present

## 2021-06-18 DIAGNOSIS — M898X1 Other specified disorders of bone, shoulder: Secondary | ICD-10-CM | POA: Diagnosis not present

## 2021-07-26 DIAGNOSIS — Z20822 Contact with and (suspected) exposure to covid-19: Secondary | ICD-10-CM | POA: Diagnosis not present

## 2021-08-16 IMAGING — US US ABDOMEN COMPLETE
1 series · 14 of 25 positions shown · non-contrast
Comparison: None.

CLINICAL DATA: Acute abdominal pain

EXAM:
ABDOMEN ULTRASOUND COMPLETE

[Series 1: us abdomen complete · 14 of 89 slices shown]
[im 1/89]
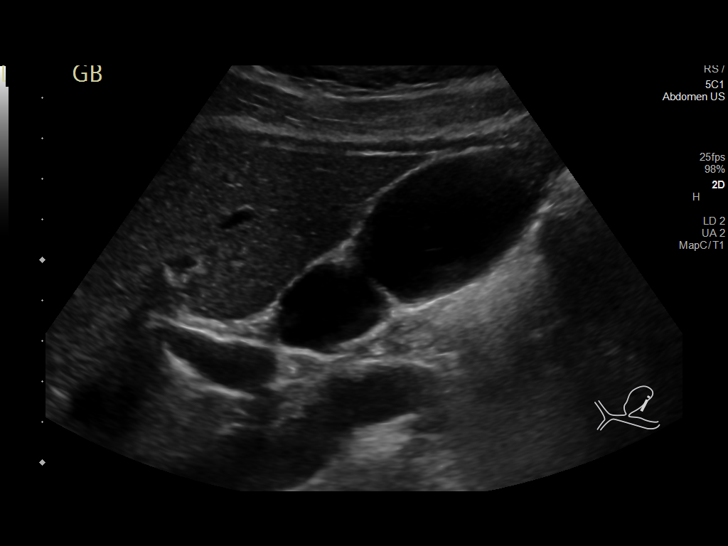
[im 8/89]
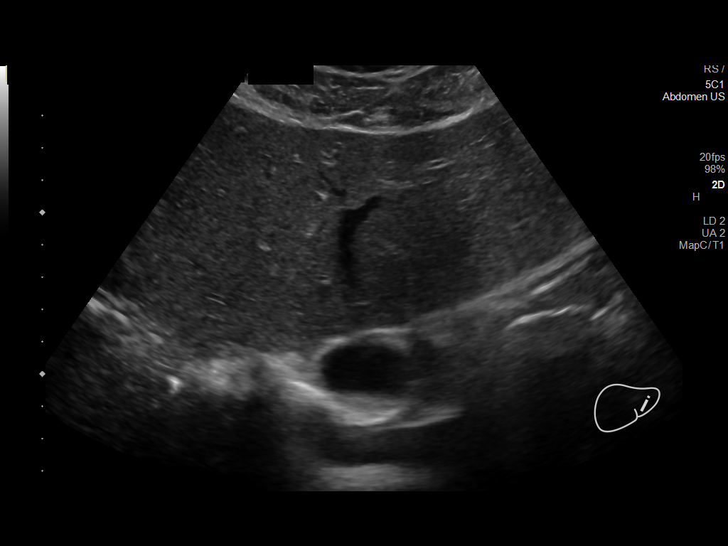
[im 15/89]
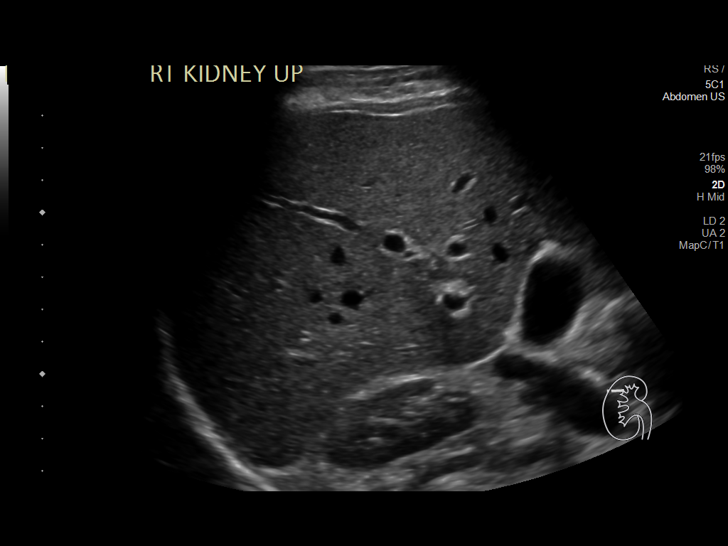
[im 23/89]
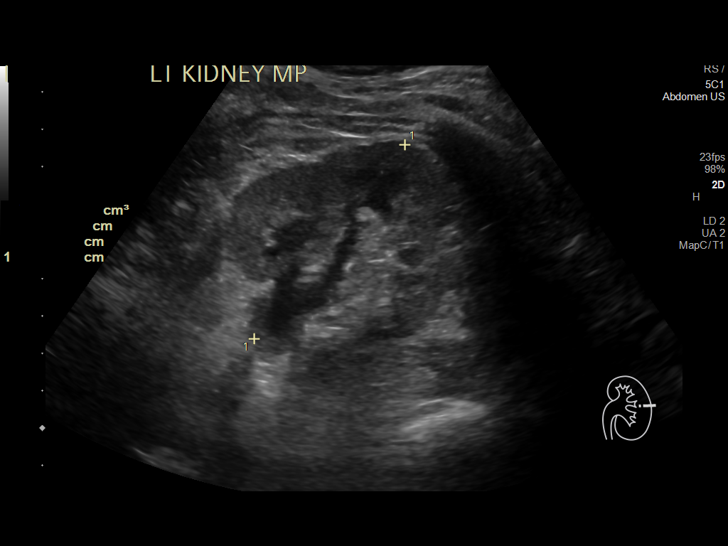
[im 30/89]
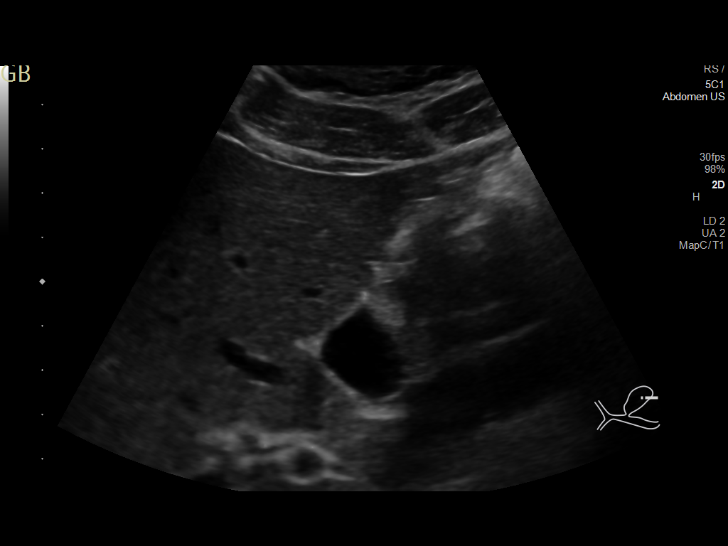
[im 34/89]
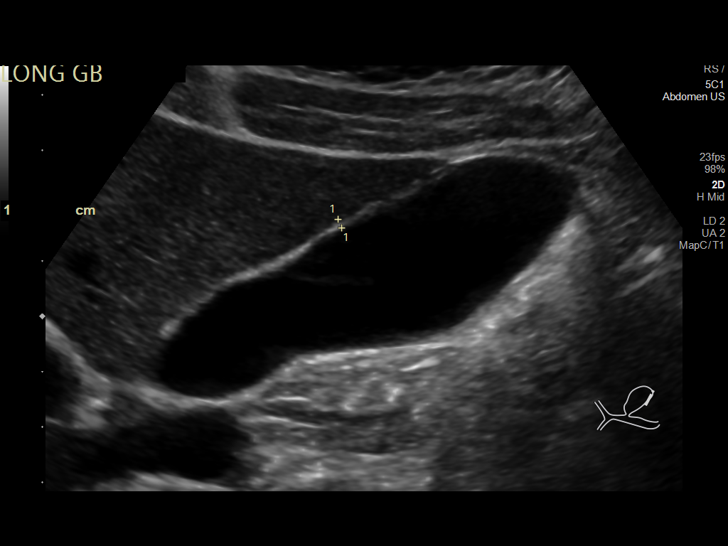
[im 41/89]
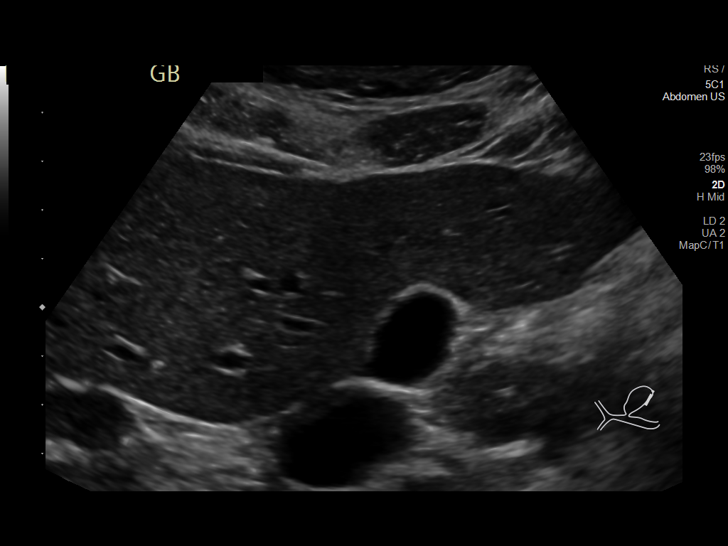
[im 48/89]
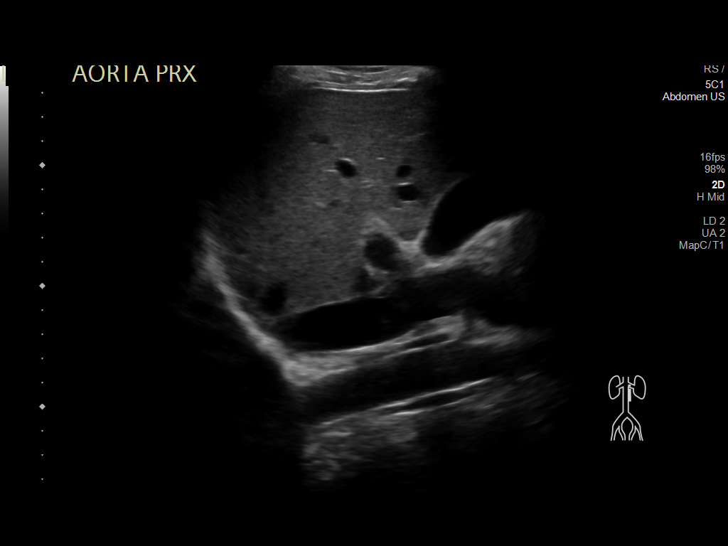
[im 56/89]
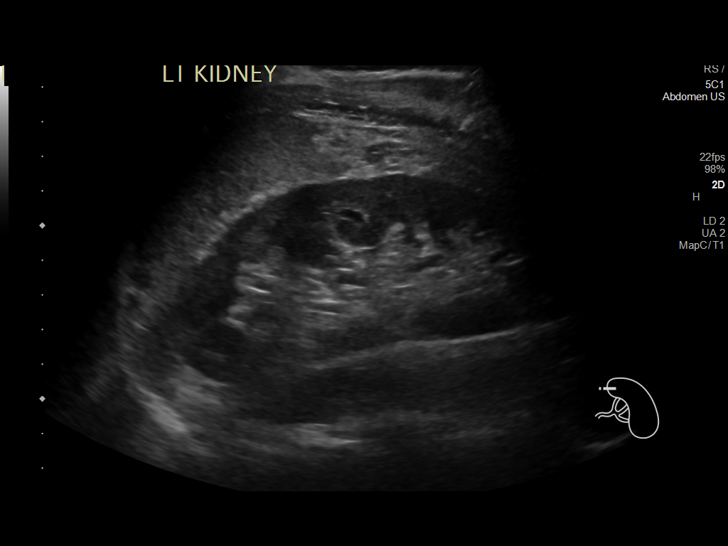
[im 59/89]
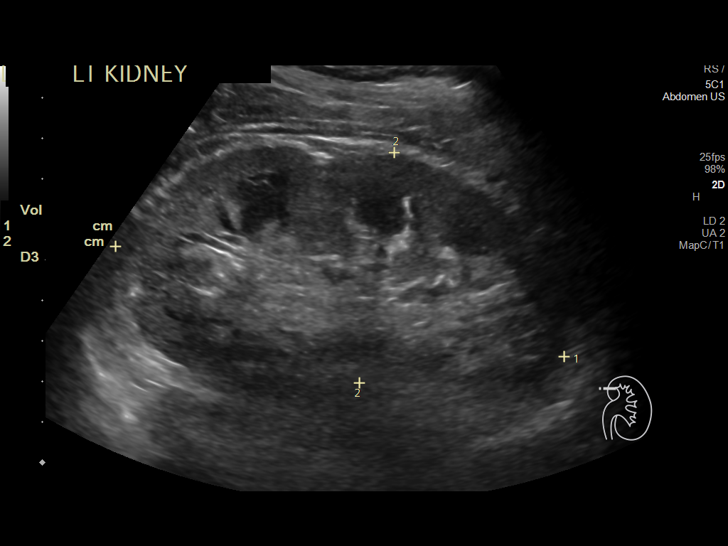
[im 67/89]
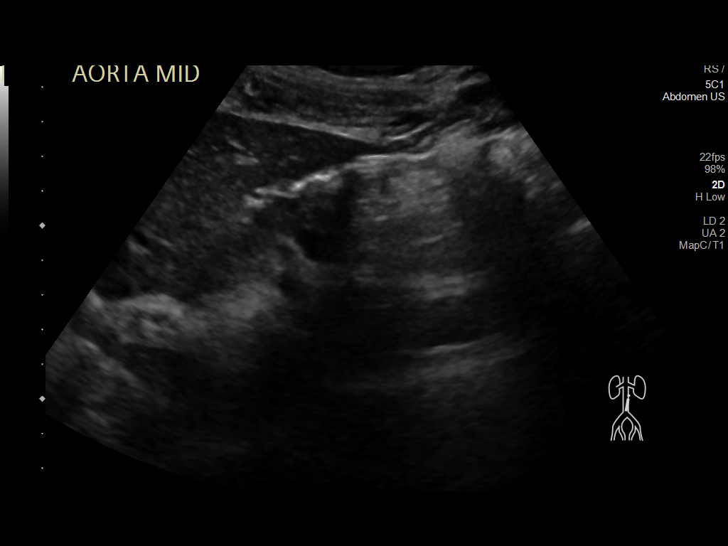
[im 74/89]
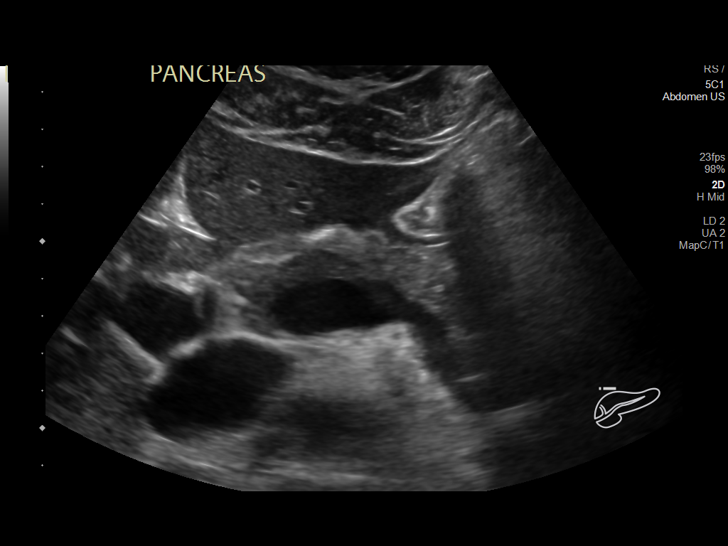
[im 81/89]
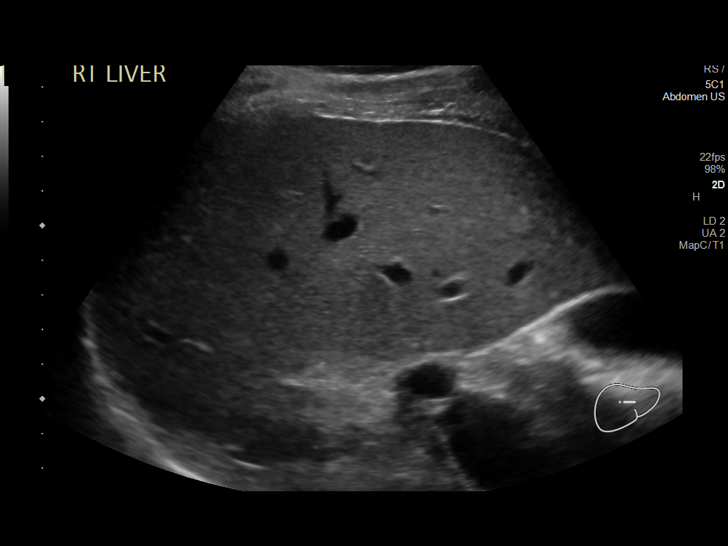
[im 89/89]
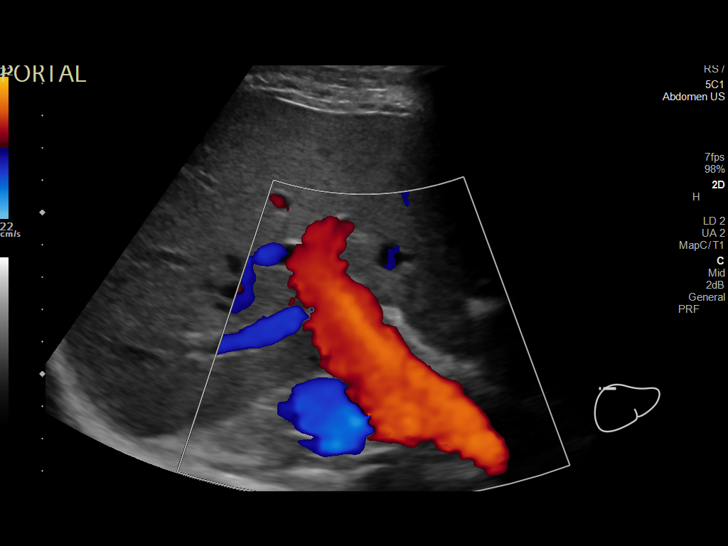

[14 of 25 positions shown; findings below may reference images not displayed]

FINDINGS: Gallbladder: No gallstones or wall thickening visualized. No
sonographic Murphy sign noted by sonographer.

Common bile duct: Diameter: 2 mm, normal

Liver: No focal lesion identified. Within normal limits in
parenchymal echogenicity. Portal vein is patent on color Doppler
imaging with normal direction of blood flow towards the liver.

IVC: No abnormality visualized.

Pancreas: Visualized portion unremarkable.

Spleen: Size and appearance within normal limits.

Right Kidney: Length: 11.4 cm. Echogenicity within normal limits. No
mass or hydronephrosis visualized.

Left Kidney: Length: 11.4 cm. Echogenicity within normal limits. No
mass or hydronephrosis visualized.

Abdominal aorta: No aneurysm visualized.

Other findings: None.
IMPRESSION: Normal ultrasound of the abdomen.

## 2021-08-19 IMAGING — CT CT ABD-PELV W/ CM
2 of 4 series · 15 of 46 positions shown, 17 images · IV contrast (omnipaque)
Comparison: None.

CLINICAL DATA: Palpable abnormality with pain at the umbilicus, a
recent ultrasound showed no evidence of herniation.

EXAM:
CT ABDOMEN AND PELVIS WITH CONTRAST
TECHNIQUE: Multidetector CT imaging of the abdomen and pelvis was performed
using the standard protocol following bolus administration of
intravenous contrast.
CONTRAST:  100mL OMNIPAQUE IOHEXOL 300 MG/ML  SOLN

[Series 2: axial st · axial · 0.84mm/px · z∈[-558,-98]mm · 12 of 110 slices shown, 14 images]
[im 9/110  soft-tissue]
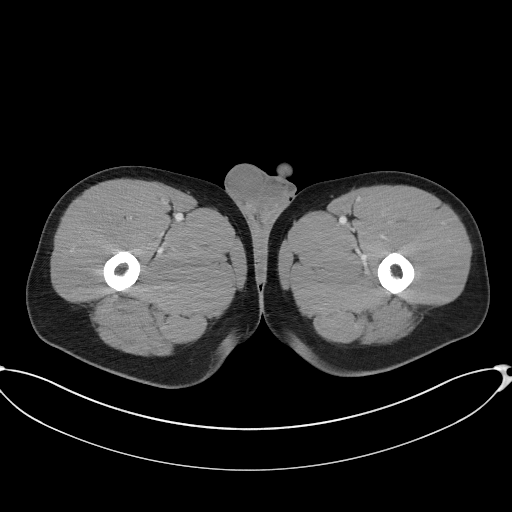
[im 9/110  bone]
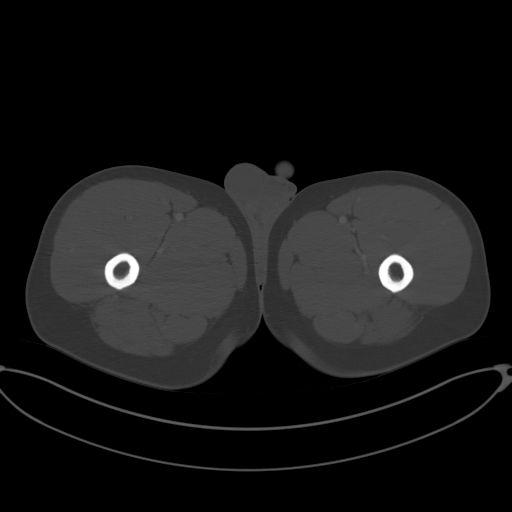
[im 17/110  soft-tissue]
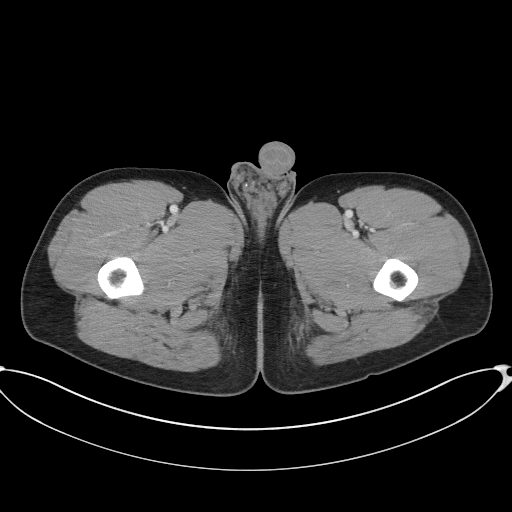
[im 26/110  soft-tissue]
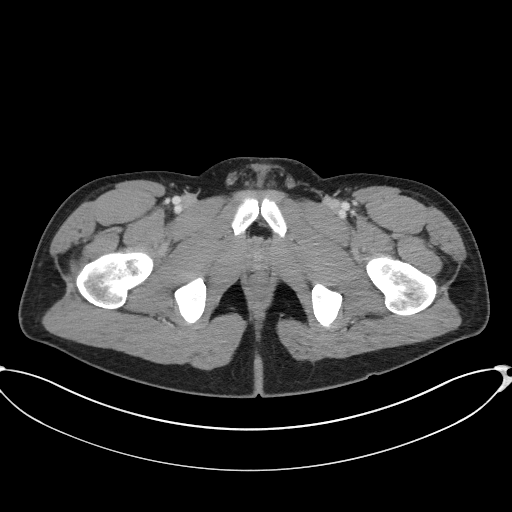
[im 34/110  soft-tissue]
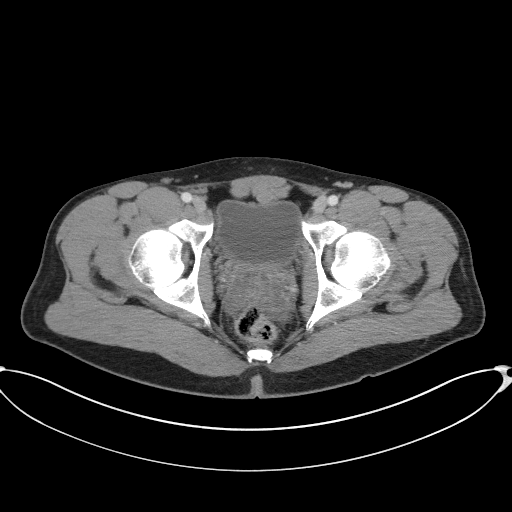
[im 42/110  soft-tissue]
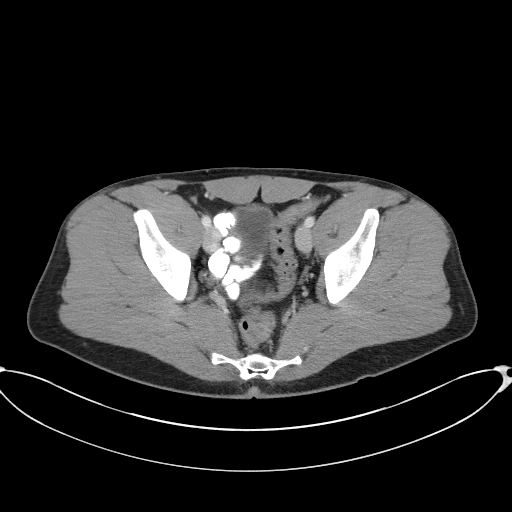
[im 51/110  soft-tissue]
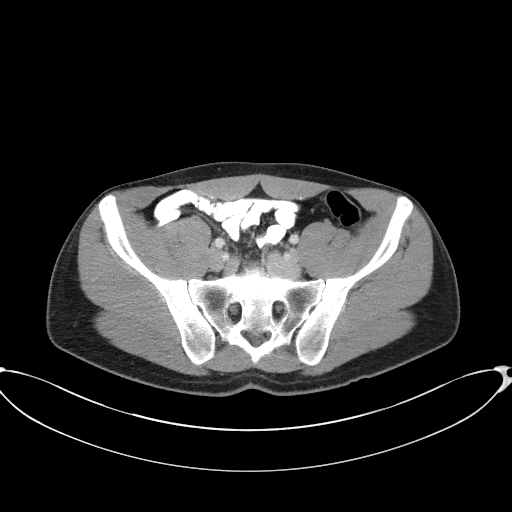
[im 59/110  soft-tissue]
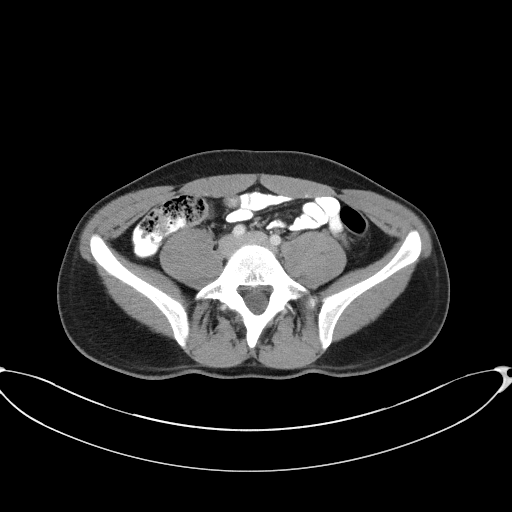
[im 68/110  soft-tissue]
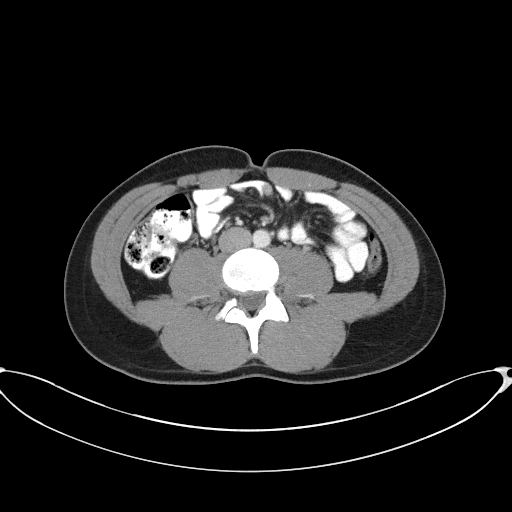
[im 76/110  soft-tissue]
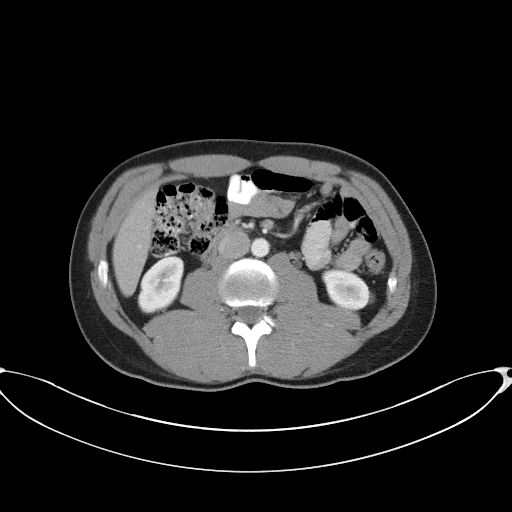
[im 76/110  bone]
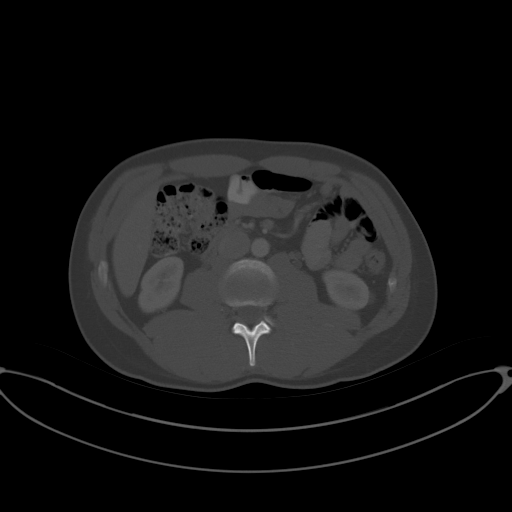
[im 84/110  soft-tissue]
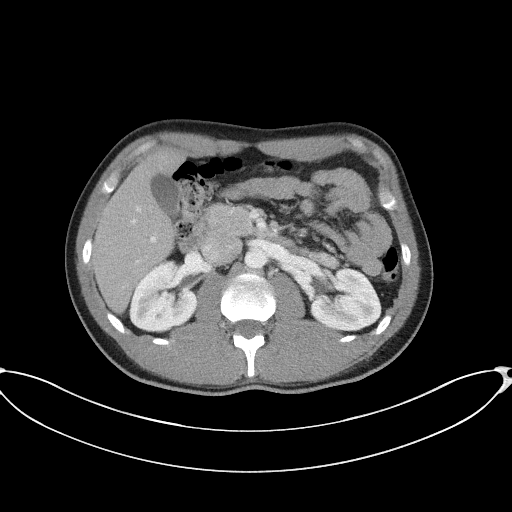
[im 93/110  soft-tissue]
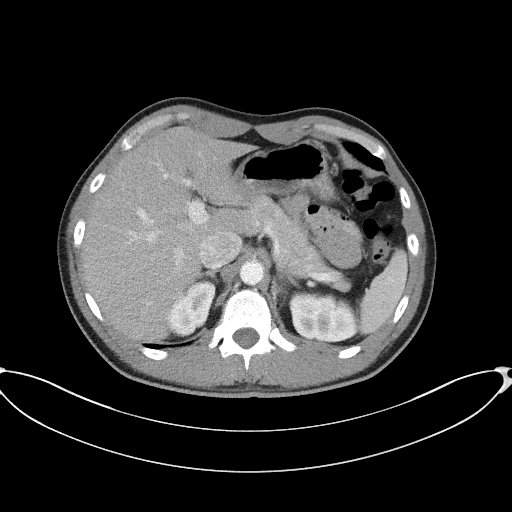
[im 101/110  soft-tissue]
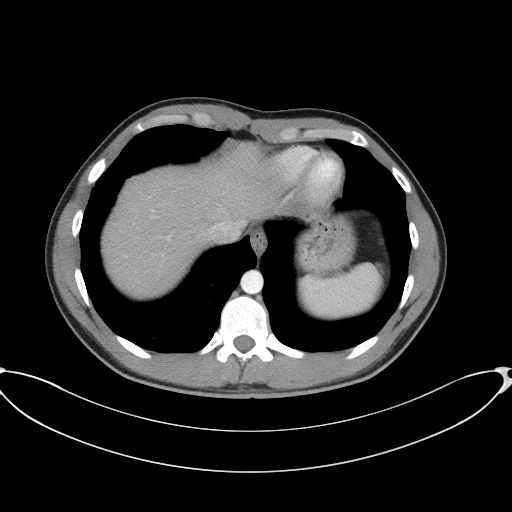

[Series 5: coronal st · coronal · 0.79mm/px · 3 of 85 slices shown]
[im 29/85  soft-tissue]
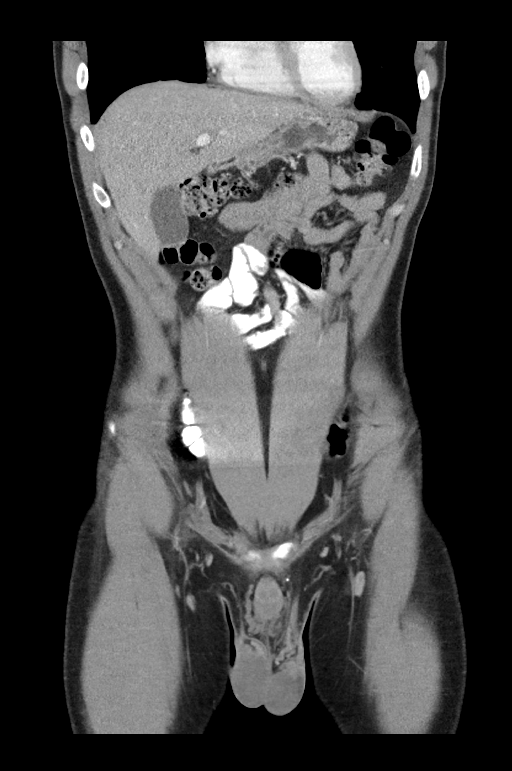
[im 38/85  soft-tissue]
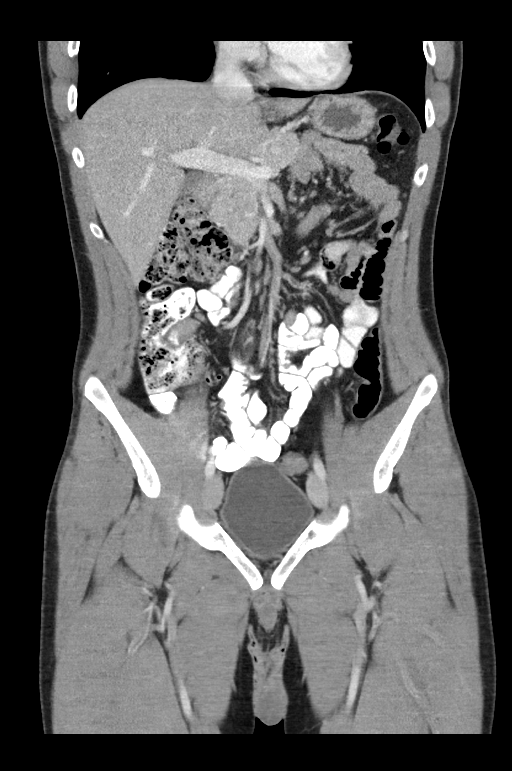
[im 47/85  soft-tissue]
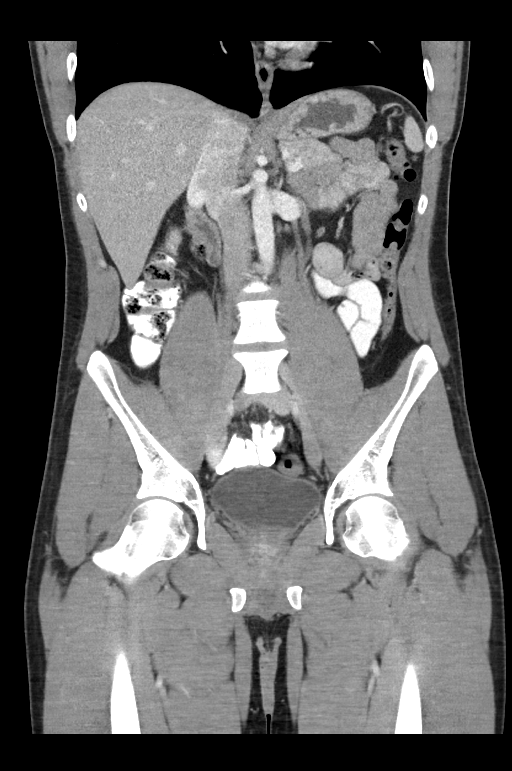

[15 of 46 positions shown; findings below may reference images not displayed]

FINDINGS: Lower chest: No acute abnormality.

Hepatobiliary: Mild fatty infiltration of the liver is noted. The
gallbladder is within normal limits. No hepatic mass is seen.

Pancreas: Unremarkable. No pancreatic ductal dilatation or
surrounding inflammatory changes.

Spleen: Normal in size without focal abnormality.

Adrenals/Urinary Tract: Adrenal glands are within normal limits.
Kidneys demonstrate a normal enhancement pattern bilaterally. No
renal calculi or obstructive changes are noted. The bladder is
partially distended.

Stomach/Bowel: Appendix is well visualized and within normal limits.
No obstructive or inflammatory changes of the colon or small bowel
are seen. The stomach is within normal limits.

Vascular/Lymphatic: No significant vascular findings are present. No
enlarged abdominal or pelvic lymph nodes.

Reproductive: Prostate is unremarkable.

Other: No abdominal wall hernia or abnormality. No abdominopelvic
ascites

Musculoskeletal: No acute or significant osseous findings.
IMPRESSION: Mild fatty infiltration of the liver.

No acute abnormality is identified within the abdomen or pelvis.

## 2021-09-23 DIAGNOSIS — J029 Acute pharyngitis, unspecified: Secondary | ICD-10-CM | POA: Diagnosis not present

## 2021-09-23 DIAGNOSIS — J069 Acute upper respiratory infection, unspecified: Secondary | ICD-10-CM | POA: Diagnosis not present

## 2021-09-23 DIAGNOSIS — Z03818 Encounter for observation for suspected exposure to other biological agents ruled out: Secondary | ICD-10-CM | POA: Diagnosis not present

## 2021-09-23 DIAGNOSIS — R059 Cough, unspecified: Secondary | ICD-10-CM | POA: Diagnosis not present

## 2021-09-23 DIAGNOSIS — H9313 Tinnitus, bilateral: Secondary | ICD-10-CM | POA: Diagnosis not present

## 2021-09-27 DIAGNOSIS — H9313 Tinnitus, bilateral: Secondary | ICD-10-CM | POA: Diagnosis not present

## 2021-09-27 DIAGNOSIS — M545 Low back pain, unspecified: Secondary | ICD-10-CM | POA: Diagnosis not present

## 2021-10-05 IMAGING — NM NM HEPATO W/GB/PHARM/[PERSON_NAME]
2 series · 12 of 12 positions shown · non-contrast
Comparison: None.

CLINICAL DATA: Right upper quadrant pain.

EXAM:
NUCLEAR MEDICINE HEPATOBILIARY IMAGING WITH GALLBLADDER EF
TECHNIQUE: Sequential images of the abdomen were obtained [DATE] minutes
following intravenous administration of radiopharmaceutical. After
oral ingestion of Ensure, gallbladder ejection fraction was
determined. At 60 min, normal ejection fraction is greater than 33%.
RADIOPHARMACEUTICALS:  4.8 mCi Nc-88m  Choletec IV

[Series 1: hida scan · 3.28mm/px · 6 of 60 frames shown (1 of 2)]
[frame 6/60]
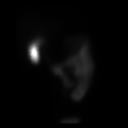
[frame 16/60]
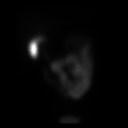
[frame 26/60]
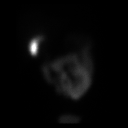
[frame 36/60]
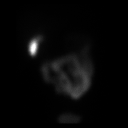
[frame 46/60]
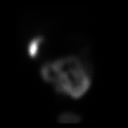
[frame 56/60]
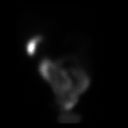

[Series 1: hida scan · 3.28mm/px · 6 of 60 frames shown (2 of 2)]
[frame 6/60]
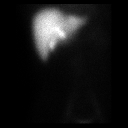
[frame 16/60]
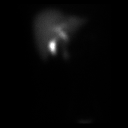
[frame 26/60]
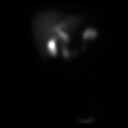
[frame 36/60]
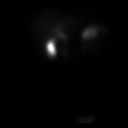
[frame 46/60]
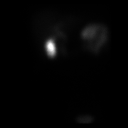
[frame 56/60]
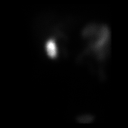

[12 of 12 positions shown; findings below may reference images not displayed]

FINDINGS: Prompt uptake and biliary excretion of activity by the liver is
seen. Gallbladder activity is visualized, consistent with patency of
cystic duct. Biliary activity passes into small bowel, consistent
with patent common bile duct.

Calculated gallbladder ejection fraction is 40%. (Normal gallbladder
ejection fraction with Ensure is greater than 33%.)
IMPRESSION: Normal hepatobiliary scan, demonstrating patency of cystic and
common bile ducts.

Normal gallbladder ejection fraction.

## 2021-10-26 DIAGNOSIS — H6980 Other specified disorders of Eustachian tube, unspecified ear: Secondary | ICD-10-CM | POA: Diagnosis not present

## 2021-10-26 DIAGNOSIS — H93293 Other abnormal auditory perceptions, bilateral: Secondary | ICD-10-CM | POA: Diagnosis not present

## 2021-12-07 DIAGNOSIS — H903 Sensorineural hearing loss, bilateral: Secondary | ICD-10-CM | POA: Diagnosis not present

## 2022-01-24 DIAGNOSIS — R053 Chronic cough: Secondary | ICD-10-CM | POA: Diagnosis not present

## 2022-01-25 ENCOUNTER — Ambulatory Visit
Admission: RE | Admit: 2022-01-25 | Discharge: 2022-01-25 | Disposition: A | Discharge status: Home / Self Care | Payer: BC Managed Care – PPO | Source: Ambulatory Visit | Attending: Physician Assistant | Admitting: Physician Assistant

## 2022-01-25 ENCOUNTER — Other Ambulatory Visit: Payer: Self-pay | Admitting: Physician Assistant

## 2022-01-25 DIAGNOSIS — R053 Chronic cough: Secondary | ICD-10-CM

## 2022-01-25 DIAGNOSIS — R0602 Shortness of breath: Secondary | ICD-10-CM | POA: Diagnosis not present

## 2022-01-25 DIAGNOSIS — R059 Cough, unspecified: Secondary | ICD-10-CM | POA: Diagnosis not present

## 2022-03-29 DIAGNOSIS — J302 Other seasonal allergic rhinitis: Secondary | ICD-10-CM | POA: Diagnosis not present

## 2022-03-29 DIAGNOSIS — R0602 Shortness of breath: Secondary | ICD-10-CM | POA: Diagnosis not present

## 2022-03-29 DIAGNOSIS — Z0000 Encounter for general adult medical examination without abnormal findings: Secondary | ICD-10-CM | POA: Diagnosis not present

## 2022-04-23 NOTE — Progress Notes (Unsigned)
   Synopsis: Referred in September 2023 for dyspnea  Subjective:   PATIENT ID: David Elliott GENDER: male DOB: August 09, 1990, MRN: [id number]   HPI  No chief complaint on file.   ***  Past Medical History:  Diagnosis Date   ADHD (attention deficit hyperactivity disorder) 03/17/2020     Family History  Problem Relation Age of Onset   ADD / ADHD Father    Atrial fibrillation Father    ADD / ADHD Sister    Cancer Maternal Grandmother    Cancer Maternal Grandfather    Heart disease Maternal Grandfather    Atrial fibrillation Paternal Uncle    Stomach cancer Neg Hx    Rectal cancer Neg Hx    Colon cancer Neg Hx    Esophageal cancer Neg Hx      Social History   Socioeconomic History   Marital status: Married    Spouse name: Not on file   Number of children: Not on file   Years of education: Not on file   Highest education level: Not on file  Occupational History   Occupation: COMPUTER PROGRAMER  Tobacco Use   Smoking status: Never   Smokeless tobacco: Never  Vaping Use   Vaping Use: Never used  Substance and Sexual Activity   Alcohol use: Yes   Drug use: Yes    Frequency: 2.0 times per week    Types: Marijuana   Sexual activity: Yes    Birth control/protection: Condom  Other Topics Concern   Not on file  Social History Narrative   Not on file   Social Determinants of Health   Financial Resource Strain: Not on file  Food Insecurity: Not on file  Transportation Needs: Not on file  Physical Activity: Not on file  Stress: Not on file  Social Connections: Not on file  Intimate Partner Violence: Not on file     Allergies  Allergen Reactions   Oxycodone-Acetaminophen Nausea Only     No outpatient medications prior to visit.   No facility-administered medications prior to visit.    ROS    Objective:  Physical Exam   There were no vitals filed for this visit.  ***  CBC    Component Value Date/Time   WBC 4.7 03/29/2020 1249   RBC 4.52  03/29/2020 1249   HGB 14.3 03/29/2020 1249   HCT 42.0 03/29/2020 1249   PLT 228 03/29/2020 1249   MCV 92.9 03/29/2020 1249   MCH 31.6 03/29/2020 1249   MCHC 34.0 03/29/2020 1249   RDW 12.8 03/29/2020 1249     Chest imaging: June 2023 chest x-ray 2 view images independently reviewed showing normal pulmonary parenchyma, normal cardiac silhouette  PFT:  Labs:  Path:  Echo:  Heart Catheterization:       Assessment & Plan:   No diagnosis found.  Discussion: ***  Immunizations: Immunization History  Administered Date(s) Administered   DTaP 08/24/1993, 10/29/1993, 01/07/1994, 10/03/1994, 01/15/1999   HIB (PRP-OMP) 08/24/1993, 10/29/1993, 01/07/1994, 10/03/1994   HPV Quadrivalent 04/10/2010, 10/02/2010, 02/26/2011   Hepatitis A 05/20/2006, 06/08/2007   Hepatitis B 09/08/92, 08/24/1993, 01/07/1994   IPV 08/24/1993, 10/29/1993, 01/07/1994, 01/15/1999   MMR 10/03/1994, 01/15/1999   Meningococcal Conjugate 05/20/2006, 04/10/2010   Tdap 05/09/2005   Varicella 09/05/1997    No current outpatient medications on file.

## 2022-04-24 ENCOUNTER — Ambulatory Visit (INDEPENDENT_AMBULATORY_CARE_PROVIDER_SITE_OTHER): Payer: BC Managed Care – PPO | Admitting: Pulmonary Disease

## 2022-04-24 ENCOUNTER — Encounter: Payer: Self-pay | Admitting: Pulmonary Disease

## 2022-04-24 VITALS — BP 114/72 | HR 64 | Ht 73.0 in | Wt 195.0 lb

## 2022-04-24 DIAGNOSIS — K219 Gastro-esophageal reflux disease without esophagitis: Secondary | ICD-10-CM | POA: Diagnosis not present

## 2022-04-24 DIAGNOSIS — J4520 Mild intermittent asthma, uncomplicated: Secondary | ICD-10-CM

## 2022-04-24 DIAGNOSIS — J309 Allergic rhinitis, unspecified: Secondary | ICD-10-CM

## 2022-04-24 LAB — NITRIC OXIDE: Nitric Oxide: 15

## 2022-04-24 NOTE — Patient Instructions (Addendum)
Mild intermittent asthma: Today's lung function test showed mild airflow obstruction As long as your symptoms are less than twice a week, you have no nighttime symptoms, at this point there is no need to add other therapy However, if your symptoms worsen then we would want to consider an inhaled corticosteroid or montelukast Practice good hand hygiene Stay physically active Make sure your immunizations are up-to-date Keep using albuterol 2 puffs every 4-6 hours as needed for chest tightness wheezing or shortness of breath  Allergic rhinitis: Try Flonase 2 sprays each nostril daily Continue Allegra  Gastroesophageal reflux: Avoid fatty foods, alcohol, chocolate, caffeine and tobacco products No eating within 3 hours of bedtime  If your symptoms have not improved by the next visit we can consider further testing.  We will see you back in 6 weeks or sooner if needed

## 2022-05-01 DIAGNOSIS — Z0000 Encounter for general adult medical examination without abnormal findings: Secondary | ICD-10-CM | POA: Diagnosis not present

## 2022-05-01 DIAGNOSIS — Z13220 Encounter for screening for lipoid disorders: Secondary | ICD-10-CM | POA: Diagnosis not present

## 2022-06-11 NOTE — Progress Notes (Unsigned)
   Synopsis: Referred in September 2023 for dyspnea, had mild airflow obstruction on spirometry, diagnosed with mild intermittent asthma.  Also felt to have some degree of upper airway component related to allergic rhinitis and gastroesophageal reflux disease.  Subjective:   PATIENT ID: David Elliott GENDER: male DOB: November 25, 1990, MRN: [id number]   HPI  No chief complaint on file.  ***Started Flonase last visit, GERD lifestyle changes recommended, no inhaled corticosteroid, consider VCD work-up if not improved.  Past Medical History:  Diagnosis Date   ADHD (attention deficit hyperactivity disorder) 03/17/2020    ROS    Objective:  Physical Exam   There were no vitals filed for this visit.   ***   CBC    Component Value Date/Time   WBC 4.7 03/29/2020 1249   RBC 4.52 03/29/2020 1249   HGB 14.3 03/29/2020 1249   HCT 42.0 03/29/2020 1249   PLT 228 03/29/2020 1249   MCV 92.9 03/29/2020 1249   MCH 31.6 03/29/2020 1249   MCHC 34.0 03/29/2020 1249   RDW 12.8 03/29/2020 1249     Chest imaging: June 2023 chest x-ray 2 view images independently reviewed showing normal pulmonary parenchyma, normal cardiac silhouette  PFT: September 2023 spirometry ratio 69%, FEV1 4.21 L 86% predicted  Exhaled nitric oxide: September 2023 15 ppb  Labs:  Path:  Echo:  Heart Catheterization:       Assessment & Plan:   No diagnosis found.  Discussion:  ***  Immunizations: Immunization History  Administered Date(s) Administered   DTaP 08/24/1993, 10/29/1993, 01/07/1994, 10/03/1994, 01/15/1999   HIB (PRP-OMP) 08/24/1993, 10/29/1993, 01/07/1994, 10/03/1994   HPV Quadrivalent 04/10/2010, 10/02/2010, 02/26/2011   Hepatitis A 05/20/2006, 06/08/2007   Hepatitis B Jul 09, 1993, 08/24/1993, 01/07/1994   IPV 08/24/1993, 10/29/1993, 01/07/1994, 01/15/1999   MMR 10/03/1994, 01/15/1999   Meningococcal Conjugate 05/20/2006, 04/10/2010   Tdap 05/09/2005   Varicella 09/05/1997      Current Outpatient Medications:    albuterol (VENTOLIN HFA) 108 (90 Base) MCG/ACT inhaler, Inhale into the lungs., Disp: , Rfl:    fexofenadine (ALLEGRA) 180 MG tablet, Take 180 mg by mouth daily., Disp: , Rfl:

## 2022-06-12 ENCOUNTER — Encounter: Payer: Self-pay | Admitting: Pulmonary Disease

## 2022-06-12 ENCOUNTER — Ambulatory Visit (INDEPENDENT_AMBULATORY_CARE_PROVIDER_SITE_OTHER): Payer: BC Managed Care – PPO | Admitting: Pulmonary Disease

## 2022-06-12 VITALS — BP 118/72 | HR 66 | Ht 73.0 in | Wt 189.6 lb

## 2022-06-12 DIAGNOSIS — J4520 Mild intermittent asthma, uncomplicated: Secondary | ICD-10-CM

## 2022-06-12 DIAGNOSIS — K219 Gastro-esophageal reflux disease without esophagitis: Secondary | ICD-10-CM

## 2022-06-12 DIAGNOSIS — J309 Allergic rhinitis, unspecified: Secondary | ICD-10-CM | POA: Diagnosis not present

## 2022-06-12 MED ORDER — FLUTICASONE PROPIONATE 50 MCG/ACT NA SUSP: 2.0000 | Freq: Every day | NASAL | 11 refills | Status: AC

## 2022-06-12 NOTE — Patient Instructions (Signed)
Mild intermittent asthma: Keep the allergic rhinitis and reflux symptoms under control Let me know if you are needing to use albuterol more than twice per week during the daytime or more than twice per month at night Keep using albuterol as needed for chest tightness wheezing or shortness of breath Practice good hand hygiene Glad your immunizations are up-to-date  Allergic rhinitis: Continue fluticasone2 sprays each nostril daily Continue over-the-counter Allegra  Gastroesophageal reflux: Minimize eating within 3 hours of bedtime Avoid fatty foods, chocolate, mint, caffeine  We will see you back on an as-needed basis

## 2022-08-26 DIAGNOSIS — M25562 Pain in left knee: Secondary | ICD-10-CM | POA: Diagnosis not present

## 2022-09-10 DIAGNOSIS — M25562 Pain in left knee: Secondary | ICD-10-CM | POA: Diagnosis not present

## 2023-04-17 DIAGNOSIS — R070 Pain in throat: Secondary | ICD-10-CM | POA: Diagnosis not present

## 2023-04-17 DIAGNOSIS — Z0000 Encounter for general adult medical examination without abnormal findings: Secondary | ICD-10-CM | POA: Diagnosis not present

## 2023-04-17 DIAGNOSIS — Z13220 Encounter for screening for lipoid disorders: Secondary | ICD-10-CM | POA: Diagnosis not present

## 2023-04-17 DIAGNOSIS — G2581 Restless legs syndrome: Secondary | ICD-10-CM | POA: Diagnosis not present

## 2023-04-30 DIAGNOSIS — M25562 Pain in left knee: Secondary | ICD-10-CM | POA: Diagnosis not present

## 2023-06-04 DIAGNOSIS — J309 Allergic rhinitis, unspecified: Secondary | ICD-10-CM | POA: Diagnosis not present

## 2023-06-04 DIAGNOSIS — J4520 Mild intermittent asthma, uncomplicated: Secondary | ICD-10-CM | POA: Diagnosis not present

## 2023-06-04 DIAGNOSIS — K219 Gastro-esophageal reflux disease without esophagitis: Secondary | ICD-10-CM | POA: Diagnosis not present

## 2023-06-10 DIAGNOSIS — J309 Allergic rhinitis, unspecified: Secondary | ICD-10-CM | POA: Diagnosis not present

## 2023-06-10 DIAGNOSIS — J3489 Other specified disorders of nose and nasal sinuses: Secondary | ICD-10-CM | POA: Diagnosis not present

## 2023-06-10 DIAGNOSIS — M542 Cervicalgia: Secondary | ICD-10-CM | POA: Diagnosis not present

## 2023-07-14 DIAGNOSIS — M6289 Other specified disorders of muscle: Secondary | ICD-10-CM | POA: Diagnosis not present

## 2023-07-14 DIAGNOSIS — M545 Low back pain, unspecified: Secondary | ICD-10-CM | POA: Diagnosis not present

## 2023-07-14 DIAGNOSIS — M62830 Muscle spasm of back: Secondary | ICD-10-CM | POA: Diagnosis not present
